# Patient Record
Sex: Female | Born: 1995 | Race: White | Hispanic: No | Marital: Single | Smoking: Never smoker
Health system: Southern US, Community
[De-identification: ages and names within clinical notes are randomized; demographics above are authoritative.]

## PROBLEM LIST (undated history)

## (undated) DIAGNOSIS — Z789 Other specified health status: Secondary | ICD-10-CM

## (undated) DIAGNOSIS — O159 Eclampsia, unspecified as to time period: Secondary | ICD-10-CM

## (undated) HISTORY — DX: Eclampsia, unspecified as to time period: O15.9

---

## 2017-05-19 DIAGNOSIS — B001 Herpesviral vesicular dermatitis: Secondary | ICD-10-CM | POA: Insufficient documentation

## 2017-05-19 DIAGNOSIS — F32A Depression, unspecified: Secondary | ICD-10-CM | POA: Insufficient documentation

## 2017-05-19 DIAGNOSIS — F329 Major depressive disorder, single episode, unspecified: Secondary | ICD-10-CM | POA: Insufficient documentation

## 2018-12-10 ENCOUNTER — Inpatient Hospital Stay (HOSPITAL_COMMUNITY): Payer: Medicaid Other

## 2018-12-10 ENCOUNTER — Inpatient Hospital Stay (HOSPITAL_COMMUNITY): Payer: Medicaid Other | Admitting: Certified Registered"

## 2018-12-10 ENCOUNTER — Other Ambulatory Visit: Payer: Self-pay

## 2018-12-10 ENCOUNTER — Encounter (HOSPITAL_COMMUNITY): Payer: Self-pay

## 2018-12-10 ENCOUNTER — Inpatient Hospital Stay (HOSPITAL_COMMUNITY)
Admission: AD | Admit: 2018-12-10 | Discharge: 2018-12-13 | DRG: 787 | Disposition: A | Discharge status: Home / Self Care | Payer: Medicaid Other | Attending: Obstetrics & Gynecology | Admitting: Obstetrics & Gynecology

## 2018-12-10 ENCOUNTER — Encounter (HOSPITAL_COMMUNITY)
Admission: AD | Disposition: A | Discharge status: Home / Self Care | Payer: Self-pay | Source: Home / Self Care | Attending: Obstetrics & Gynecology

## 2018-12-10 DIAGNOSIS — Z363 Encounter for antenatal screening for malformations: Secondary | ICD-10-CM

## 2018-12-10 DIAGNOSIS — O4103X0 Oligohydramnios, third trimester, not applicable or unspecified: Secondary | ICD-10-CM | POA: Diagnosis present

## 2018-12-10 DIAGNOSIS — Z3490 Encounter for supervision of normal pregnancy, unspecified, unspecified trimester: Secondary | ICD-10-CM

## 2018-12-10 DIAGNOSIS — O151 Eclampsia in labor: Principal | ICD-10-CM | POA: Diagnosis present

## 2018-12-10 DIAGNOSIS — O159 Eclampsia, unspecified as to time period: Secondary | ICD-10-CM | POA: Diagnosis present

## 2018-12-10 DIAGNOSIS — O1500 Eclampsia in pregnancy, unspecified trimester: Secondary | ICD-10-CM

## 2018-12-10 DIAGNOSIS — Z3A34 34 weeks gestation of pregnancy: Secondary | ICD-10-CM

## 2018-12-10 DIAGNOSIS — Z98891 History of uterine scar from previous surgery: Secondary | ICD-10-CM

## 2018-12-10 LAB — RAPID HIV SCREEN (HIV 1/2 AB+AG)
HIV 1/2 Antibodies: NONREACTIVE
HIV-1 P24 Antigen - HIV24: NONREACTIVE

## 2018-12-10 LAB — WET PREP, GENITAL
Clue Cells Wet Prep HPF POC: NONE SEEN
Sperm: NONE SEEN
Trich, Wet Prep: NONE SEEN

## 2018-12-10 LAB — PROTEIN / CREATININE RATIO, URINE
Creatinine, Urine: 23.54 mg/dL
Protein Creatinine Ratio: 30.42 mg/mg{Cre} — ABNORMAL HIGH (ref 0.00–0.15)
Total Protein, Urine: 716 mg/dL

## 2018-12-10 LAB — COMPREHENSIVE METABOLIC PANEL
ALT: 27 U/L (ref 0–44)
AST: 53 U/L — ABNORMAL HIGH (ref 15–41)
Albumin: 2.5 g/dL — ABNORMAL LOW (ref 3.5–5.0)
Alkaline Phosphatase: 108 U/L (ref 38–126)
Anion gap: 13 (ref 5–15)
BUN: 11 mg/dL (ref 6–20)
CO2: 17 mmol/L — ABNORMAL LOW (ref 22–32)
Calcium: 8.9 mg/dL (ref 8.9–10.3)
Chloride: 109 mmol/L (ref 98–111)
Creatinine, Ser: 1.32 mg/dL — ABNORMAL HIGH (ref 0.44–1.00)
GFR calc Af Amer: >60 mL/min (ref >60)
GFR calc non Af Amer: 57 mL/min — ABNORMAL LOW (ref >60)
Glucose, Bld: 99 mg/dL (ref 70–99)
Potassium: 3.4 mmol/L — ABNORMAL LOW (ref 3.5–5.1)
Sodium: 139 mmol/L (ref 135–145)
Total Bilirubin: 0.5 mg/dL (ref 0.3–1.2)
Total Protein: 5.7 g/dL — ABNORMAL LOW (ref 6.5–8.1)

## 2018-12-10 LAB — RAPID URINE DRUG SCREEN, HOSP PERFORMED
Amphetamines: NOT DETECTED
Barbiturates: NOT DETECTED
Benzodiazepines: POSITIVE — AB
Cocaine: NOT DETECTED
Opiates: NOT DETECTED
Tetrahydrocannabinol: NOT DETECTED

## 2018-12-10 LAB — CBC
HCT: 38 % (ref 36.0–46.0)
Hemoglobin: 13.2 g/dL (ref 12.0–15.0)
MCH: 33.8 pg (ref 26.0–34.0)
MCHC: 34.7 g/dL (ref 30.0–36.0)
MCV: 97.4 fL (ref 80.0–100.0)
Platelets: 166 10*3/uL (ref 150–400)
RBC: 3.9 MIL/uL (ref 3.87–5.11)
RDW: 11.9 % (ref 11.5–15.5)
WBC: 19.5 10*3/uL — ABNORMAL HIGH (ref 4.0–10.5)
nRBC: 0.1 % (ref 0.0–0.2)

## 2018-12-10 LAB — HEPATITIS B SURFACE ANTIGEN: Hepatitis B Surface Ag: NEGATIVE

## 2018-12-10 SURGERY — Surgical Case
Anesthesia: Spinal | Site: Abdomen | Wound class: Clean Contaminated

## 2018-12-10 MED ORDER — NALOXONE HCL 0.4 MG/ML IJ SOLN: 0.4000 mg | INTRAMUSCULAR | Status: DC | PRN

## 2018-12-10 MED ORDER — LABETALOL HCL 5 MG/ML IV SOLN: 80.0000 mg | INTRAVENOUS | Status: DC | PRN

## 2018-12-10 MED ORDER — MENTHOL 3 MG MT LOZG: 1.0000 | LOZENGE | OROMUCOSAL | Status: DC | PRN

## 2018-12-10 MED ORDER — SCOPOLAMINE 1 MG/3DAYS TD PT72
1.0000 | MEDICATED_PATCH | Freq: Once | TRANSDERMAL | Status: AC
  Administered 2018-12-10: 1.5 mg via TRANSDERMAL

## 2018-12-10 MED ORDER — ACETAMINOPHEN 10 MG/ML IV SOLN
1000.0000 mg | Freq: Once | INTRAVENOUS | Status: DC | PRN
  Administered 2018-12-10: 1000 mg via INTRAVENOUS

## 2018-12-10 MED ORDER — PHENYLEPHRINE 40 MCG/ML (10ML) SYRINGE FOR IV PUSH (FOR BLOOD PRESSURE SUPPORT)
PREFILLED_SYRINGE | INTRAVENOUS | Status: DC | PRN
  Administered 2018-12-10: 40 ug via INTRAVENOUS
  Administered 2018-12-10 (×2): 80 ug via INTRAVENOUS

## 2018-12-10 MED ORDER — DEXAMETHASONE SODIUM PHOSPHATE 10 MG/ML IJ SOLN
INTRAMUSCULAR | Status: AC
  Filled 2018-12-10: qty 1

## 2018-12-10 MED ORDER — SCOPOLAMINE 1 MG/3DAYS TD PT72
MEDICATED_PATCH | TRANSDERMAL | Status: AC
  Filled 2018-12-10: qty 1

## 2018-12-10 MED ORDER — SODIUM CHLORIDE 0.9 % IV SOLN
INTRAVENOUS | Status: DC | PRN
  Administered 2018-12-10: 08:00:00 via INTRAVENOUS

## 2018-12-10 MED ORDER — SENNOSIDES-DOCUSATE SODIUM 8.6-50 MG PO TABS
2.0000 | ORAL_TABLET | ORAL | Status: DC
  Administered 2018-12-11 – 2018-12-12 (×3): 2 via ORAL
  Filled 2018-12-10 (×3): qty 2

## 2018-12-10 MED ORDER — ACETAMINOPHEN 10 MG/ML IV SOLN
INTRAVENOUS | Status: AC
  Filled 2018-12-10: qty 100

## 2018-12-10 MED ORDER — OXYTOCIN 40 UNITS IN NORMAL SALINE INFUSION - SIMPLE MED
INTRAVENOUS | Status: AC
  Filled 2018-12-10: qty 1000

## 2018-12-10 MED ORDER — MAGNESIUM SULFATE BOLUS VIA INFUSION: 0.0100 g | Freq: Once | INTRAVENOUS | Status: DC

## 2018-12-10 MED ORDER — BETAMETHASONE SOD PHOS & ACET 6 (3-3) MG/ML IJ SUSP
12.0000 mg | Freq: Once | INTRAMUSCULAR | Status: DC
  Filled 2018-12-10: qty 2

## 2018-12-10 MED ORDER — DIPHENHYDRAMINE HCL 50 MG/ML IJ SOLN
12.5000 mg | INTRAMUSCULAR | Status: DC | PRN
  Administered 2018-12-10: 12.5 mg via INTRAVENOUS
  Filled 2018-12-10 (×2): qty 1

## 2018-12-10 MED ORDER — CEFAZOLIN SODIUM-DEXTROSE 2-4 GM/100ML-% IV SOLN
2.0000 g | INTRAVENOUS | Status: AC
  Filled 2018-12-10: qty 100

## 2018-12-10 MED ORDER — MAGNESIUM SULFATE 40 G IN LACTATED RINGERS - SIMPLE: 2.0000 g/h | INTRAVENOUS | Status: DC

## 2018-12-10 MED ORDER — OXYCODONE-ACETAMINOPHEN 5-325 MG PO TABS
1.0000 | ORAL_TABLET | ORAL | Status: DC | PRN
  Administered 2018-12-10 – 2018-12-12 (×4): 2 via ORAL
  Filled 2018-12-10 (×4): qty 2

## 2018-12-10 MED ORDER — PRENATAL MULTIVITAMIN CH
1.0000 | ORAL_TABLET | Freq: Every day | ORAL | Status: DC
  Administered 2018-12-11 – 2018-12-13 (×3): 1 via ORAL
  Filled 2018-12-10 (×4): qty 1

## 2018-12-10 MED ORDER — OXYTOCIN 40 UNITS IN NORMAL SALINE INFUSION - SIMPLE MED: 2.5000 [IU]/h | INTRAVENOUS | Status: AC

## 2018-12-10 MED ORDER — MAGNESIUM SULFATE BOLUS VIA INFUSION
6.0000 g | Freq: Once | INTRAVENOUS | Status: AC
  Administered 2018-12-10: 6 g via INTRAVENOUS
  Filled 2018-12-10: qty 500

## 2018-12-10 MED ORDER — NALBUPHINE HCL 10 MG/ML IJ SOLN: 5.0000 mg | INTRAMUSCULAR | Status: DC | PRN

## 2018-12-10 MED ORDER — SODIUM CHLORIDE 0.9 % IV SOLN
INTRAVENOUS | Status: DC | PRN
  Administered 2018-12-10: 08:00:00 40 [IU] via INTRAVENOUS

## 2018-12-10 MED ORDER — ONDANSETRON HCL 4 MG/2ML IJ SOLN
INTRAMUSCULAR | Status: DC | PRN
  Administered 2018-12-10: 4 mg via INTRAVENOUS

## 2018-12-10 MED ORDER — SODIUM CHLORIDE 0.9 % IR SOLN
Status: DC | PRN
  Administered 2018-12-10: 1000 mL

## 2018-12-10 MED ORDER — WITCH HAZEL-GLYCERIN EX PADS: 1.0000 "application " | MEDICATED_PAD | CUTANEOUS | Status: DC | PRN

## 2018-12-10 MED ORDER — LACTATED RINGERS IV SOLN
INTRAVENOUS | Status: DC
  Administered 2018-12-10 (×2): via INTRAVENOUS

## 2018-12-10 MED ORDER — NALBUPHINE HCL 10 MG/ML IJ SOLN: 5.0000 mg | Freq: Once | INTRAMUSCULAR | Status: DC | PRN

## 2018-12-10 MED ORDER — PHENYLEPHRINE 40 MCG/ML (10ML) SYRINGE FOR IV PUSH (FOR BLOOD PRESSURE SUPPORT)
PREFILLED_SYRINGE | INTRAVENOUS | Status: AC
  Filled 2018-12-10: qty 10

## 2018-12-10 MED ORDER — LABETALOL HCL 5 MG/ML IV SOLN
INTRAVENOUS | Status: AC
  Filled 2018-12-10: qty 4

## 2018-12-10 MED ORDER — ONDANSETRON HCL 4 MG/2ML IJ SOLN
INTRAMUSCULAR | Status: AC
  Filled 2018-12-10: qty 2

## 2018-12-10 MED ORDER — GABAPENTIN 100 MG PO CAPS
100.0000 mg | ORAL_CAPSULE | Freq: Three times a day (TID) | ORAL | Status: DC
  Administered 2018-12-10 – 2018-12-13 (×9): 100 mg via ORAL
  Filled 2018-12-10 (×12): qty 1

## 2018-12-10 MED ORDER — LACTATED RINGERS IV SOLN
INTRAVENOUS | Status: DC
  Administered 2018-12-10: 06:00:00 via INTRAVENOUS

## 2018-12-10 MED ORDER — FAMOTIDINE IN NACL 20-0.9 MG/50ML-% IV SOLN
20.0000 mg | Freq: Once | INTRAVENOUS | Status: DC
  Filled 2018-12-10: qty 50

## 2018-12-10 MED ORDER — PHENYLEPHRINE HCL-NACL 20-0.9 MG/250ML-% IV SOLN
INTRAVENOUS | Status: DC | PRN
  Administered 2018-12-10: 80 ug/min via INTRAVENOUS

## 2018-12-10 MED ORDER — HYDRALAZINE HCL 20 MG/ML IJ SOLN: 10.0000 mg | INTRAMUSCULAR | Status: DC | PRN

## 2018-12-10 MED ORDER — DIBUCAINE (PERIANAL) 1 % EX OINT: 1.0000 "application " | TOPICAL_OINTMENT | CUTANEOUS | Status: DC | PRN

## 2018-12-10 MED ORDER — MAGNESIUM SULFATE 40 G IN LACTATED RINGERS - SIMPLE
2.0000 g/h | INTRAVENOUS | Status: AC
  Administered 2018-12-10: 2 g/h via INTRAVENOUS

## 2018-12-10 MED ORDER — ONDANSETRON HCL 4 MG/2ML IJ SOLN
4.0000 mg | Freq: Three times a day (TID) | INTRAMUSCULAR | Status: DC | PRN
  Administered 2018-12-10: 4 mg via INTRAVENOUS
  Filled 2018-12-10: qty 2

## 2018-12-10 MED ORDER — KETOROLAC TROMETHAMINE 30 MG/ML IJ SOLN: 30.0000 mg | Freq: Four times a day (QID) | INTRAMUSCULAR | Status: DC

## 2018-12-10 MED ORDER — ZOLPIDEM TARTRATE 5 MG PO TABS: 5.0000 mg | ORAL_TABLET | Freq: Every evening | ORAL | Status: DC | PRN

## 2018-12-10 MED ORDER — SIMETHICONE 80 MG PO CHEW: 80.0000 mg | CHEWABLE_TABLET | ORAL | Status: DC | PRN

## 2018-12-10 MED ORDER — LABETALOL HCL 5 MG/ML IV SOLN
20.0000 mg | INTRAVENOUS | Status: DC | PRN
  Administered 2018-12-10: 07:00:00 20 mg via INTRAVENOUS

## 2018-12-10 MED ORDER — BUPIVACAINE IN DEXTROSE 0.75-8.25 % IT SOLN
INTRATHECAL | Status: DC | PRN
  Administered 2018-12-10: 1.6 mL via INTRATHECAL

## 2018-12-10 MED ORDER — SOD CITRATE-CITRIC ACID 500-334 MG/5ML PO SOLN: 30.0000 mL | Freq: Once | ORAL | Status: DC

## 2018-12-10 MED ORDER — MORPHINE SULFATE (PF) 0.5 MG/ML IJ SOLN
INTRAMUSCULAR | Status: AC
  Filled 2018-12-10: qty 10

## 2018-12-10 MED ORDER — SIMETHICONE 80 MG PO CHEW
80.0000 mg | CHEWABLE_TABLET | Freq: Three times a day (TID) | ORAL | Status: DC
  Administered 2018-12-10 – 2018-12-13 (×9): 80 mg via ORAL
  Filled 2018-12-10 (×9): qty 1

## 2018-12-10 MED ORDER — SIMETHICONE 80 MG PO CHEW
80.0000 mg | CHEWABLE_TABLET | ORAL | Status: DC
  Administered 2018-12-11 – 2018-12-12 (×2): 80 mg via ORAL
  Filled 2018-12-10 (×2): qty 1

## 2018-12-10 MED ORDER — PHENYLEPHRINE HCL-NACL 20-0.9 MG/250ML-% IV SOLN
INTRAVENOUS | Status: AC
  Filled 2018-12-10: qty 250

## 2018-12-10 MED ORDER — FENTANYL CITRATE (PF) 100 MCG/2ML IJ SOLN
INTRAMUSCULAR | Status: AC
  Filled 2018-12-10: qty 2

## 2018-12-10 MED ORDER — ENOXAPARIN SODIUM 60 MG/0.6ML ~~LOC~~ SOLN
60.0000 mg | SUBCUTANEOUS | Status: DC
  Administered 2018-12-11 – 2018-12-13 (×3): 60 mg via SUBCUTANEOUS
  Filled 2018-12-10 (×3): qty 0.6

## 2018-12-10 MED ORDER — FAMOTIDINE 20 MG IN NS 100 ML IVPB
20.0000 mg | Freq: Once | INTRAVENOUS | Status: DC
  Filled 2018-12-10: qty 100

## 2018-12-10 MED ORDER — FENTANYL CITRATE (PF) 100 MCG/2ML IJ SOLN
INTRAMUSCULAR | Status: DC | PRN
  Administered 2018-12-10: 15 ug via INTRATHECAL

## 2018-12-10 MED ORDER — DIPHENHYDRAMINE HCL 25 MG PO CAPS
25.0000 mg | ORAL_CAPSULE | ORAL | Status: DC | PRN
  Filled 2018-12-10: qty 1

## 2018-12-10 MED ORDER — COCONUT OIL OIL
1.0000 "application " | TOPICAL_OIL | Status: DC | PRN
  Administered 2018-12-10: 1 via TOPICAL

## 2018-12-10 MED ORDER — TETANUS-DIPHTH-ACELL PERTUSSIS 5-2.5-18.5 LF-MCG/0.5 IM SUSP: 0.5000 mL | Freq: Once | INTRAMUSCULAR | Status: DC

## 2018-12-10 MED ORDER — DEXAMETHASONE SODIUM PHOSPHATE 10 MG/ML IJ SOLN
INTRAMUSCULAR | Status: DC | PRN
  Administered 2018-12-10: 10 mg via INTRAVENOUS

## 2018-12-10 MED ORDER — LABETALOL HCL 5 MG/ML IV SOLN
40.0000 mg | INTRAVENOUS | Status: DC | PRN
  Filled 2018-12-10: qty 8

## 2018-12-10 MED ORDER — SODIUM CHLORIDE 0.9% FLUSH: 3.0000 mL | INTRAVENOUS | Status: DC | PRN

## 2018-12-10 MED ORDER — DIPHENHYDRAMINE HCL 25 MG PO CAPS: 25.0000 mg | ORAL_CAPSULE | Freq: Four times a day (QID) | ORAL | Status: DC | PRN

## 2018-12-10 MED ORDER — MORPHINE SULFATE (PF) 0.5 MG/ML IJ SOLN
INTRAMUSCULAR | Status: DC | PRN
  Administered 2018-12-10: .15 mg via INTRATHECAL

## 2018-12-10 MED ORDER — NALOXONE HCL 4 MG/10ML IJ SOLN
1.0000 ug/kg/h | INTRAVENOUS | Status: DC | PRN
  Filled 2018-12-10: qty 5

## 2018-12-10 MED ORDER — KETOROLAC TROMETHAMINE 30 MG/ML IJ SOLN
30.0000 mg | Freq: Four times a day (QID) | INTRAMUSCULAR | Status: DC
  Administered 2018-12-10 – 2018-12-12 (×8): 30 mg via INTRAVENOUS
  Filled 2018-12-10 (×9): qty 1

## 2018-12-10 MED ORDER — CEFAZOLIN SODIUM-DEXTROSE 2-3 GM-%(50ML) IV SOLR
INTRAVENOUS | Status: DC | PRN
  Administered 2018-12-10: 2 g via INTRAVENOUS

## 2018-12-10 SURGICAL SUPPLY — 32 items
CHLORAPREP W/TINT 26ML (MISCELLANEOUS) ×3 IMPLANT
CLAMP CORD UMBIL (MISCELLANEOUS) ×3 IMPLANT
CLOTH BEACON ORANGE TIMEOUT ST (SAFETY) ×3 IMPLANT
DERMABOND ADVANCED (GAUZE/BANDAGES/DRESSINGS) ×4
DERMABOND ADVANCED .7 DNX12 (GAUZE/BANDAGES/DRESSINGS) ×2 IMPLANT
DRSG OPSITE POSTOP 4X10 (GAUZE/BANDAGES/DRESSINGS) ×3 IMPLANT
ELECT REM PT RETURN 9FT ADLT (ELECTROSURGICAL) ×3
ELECTRODE REM PT RTRN 9FT ADLT (ELECTROSURGICAL) ×1 IMPLANT
GLOVE BIOGEL PI IND STRL 7.0 (GLOVE) ×1 IMPLANT
GLOVE BIOGEL PI IND STRL 8 (GLOVE) ×1 IMPLANT
GLOVE BIOGEL PI INDICATOR 7.0 (GLOVE) ×2
GLOVE BIOGEL PI INDICATOR 8 (GLOVE) ×2
GLOVE ECLIPSE 8.0 STRL XLNG CF (GLOVE) ×3 IMPLANT
GOWN STRL REUS W/TWL LRG LVL3 (GOWN DISPOSABLE) ×6 IMPLANT
KIT ABG SYR 3ML LUER SLIP (SYRINGE) ×3 IMPLANT
NEEDLE HYPO 18GX1.5 BLUNT FILL (NEEDLE) ×3 IMPLANT
NEEDLE HYPO 22GX1.5 SAFETY (NEEDLE) ×3 IMPLANT
NEEDLE HYPO 25X5/8 SAFETYGLIDE (NEEDLE) ×3 IMPLANT
NS IRRIG 1000ML POUR BTL (IV SOLUTION) ×3 IMPLANT
PACK C SECTION WH (CUSTOM PROCEDURE TRAY) ×3 IMPLANT
PAD OB MATERNITY 4.3X12.25 (PERSONAL CARE ITEMS) ×3 IMPLANT
PENCIL SMOKE EVAC W/HOLSTER (ELECTROSURGICAL) ×3 IMPLANT
SUT CHROMIC 0 CT 1 (SUTURE) ×3 IMPLANT
SUT MNCRL 0 VIOLET CTX 36 (SUTURE) ×2 IMPLANT
SUT MONOCRYL 0 CTX 36 (SUTURE) ×4
SUT PLAIN 2 0 XLH (SUTURE) ×3 IMPLANT
SUT VIC AB 0 CTX 36 (SUTURE) ×2
SUT VIC AB 0 CTX36XBRD ANBCTRL (SUTURE) ×1 IMPLANT
SUT VIC AB 4-0 KS 27 (SUTURE) ×3 IMPLANT
SYR 20CC LL (SYRINGE) ×6 IMPLANT
TOWEL OR 17X24 6PK STRL BLUE (TOWEL DISPOSABLE) ×3 IMPLANT
WATER STERILE IRR 1000ML POUR (IV SOLUTION) ×3 IMPLANT

## 2018-12-10 NOTE — Anesthesia Procedure Notes (Signed)
Spinal  Patient location during procedure: OR Start time: 12/10/2018 7:50 AM End time: 12/10/2018 8:00 AM Staffing Anesthesiologist: Elmer Picker, MD Performed: anesthesiologist  Preanesthetic Checklist Completed: patient identified, surgical consent, pre-op evaluation, timeout performed, IV checked, risks and benefits discussed and monitors and equipment checked Spinal Block Patient position: sitting Prep: site prepped and draped and DuraPrep Patient monitoring: cardiac monitor, continuous pulse ox and blood pressure Approach: midline Location: L3-4 Injection technique: single-shot Needle Needle type: Pencan  Needle gauge: 24 G Needle length: 9 cm Assessment Sensory level: T6 Additional Notes Functioning IV was confirmed and monitors were applied. Sterile prep and drape, including hand hygiene and sterile gloves were used. The patient was positioned and the spine was prepped. The skin was anesthetized with lidocaine.  Free flow of clear CSF was obtained prior to injecting local anesthetic into the CSF.  The spinal needle aspirated freely following injection.  The needle was carefully withdrawn.  The patient tolerated the procedure well.

## 2018-12-10 NOTE — Transfer of Care (Signed)
Immediate Anesthesia Transfer of Care Note  Patient: Margaret Reyes  Procedure(s) Performed: CESAREAN SECTION (N/A Abdomen)  Patient Location: PACU  Anesthesia Type:Spinal  Level of Consciousness: awake, alert  and oriented  Airway & Oxygen Therapy: Patient Spontanous Breathing  Post-op Assessment: Report given to RN and Post -op Vital signs reviewed and stable  Post vital signs: Reviewed and stable  Last Vitals:  Vitals Value Taken Time  BP    Temp    Pulse    Resp    SpO2      Last Pain:  Vitals:   12/10/18 0610  TempSrc: Oral         Complications: No apparent anesthesia complications

## 2018-12-10 NOTE — Anesthesia Postprocedure Evaluation (Signed)
Anesthesia Post Note  Patient: Margaret Reyes  Procedure(s) Performed: CESAREAN SECTION (N/A Abdomen)     Patient location during evaluation: PACU Anesthesia Type: Spinal Level of consciousness: oriented and awake and alert Pain management: pain level controlled Vital Signs Assessment: post-procedure vital signs reviewed and stable Respiratory status: spontaneous breathing, respiratory function stable and patient connected to nasal cannula oxygen Cardiovascular status: blood pressure returned to baseline and stable Postop Assessment: no headache, no backache and no apparent nausea or vomiting Anesthetic complications: no    Last Vitals:  Vitals:   12/10/18 0731 12/10/18 0900  BP: (!) 148/104   Pulse: 88 74  Resp:    Temp:  36.7 C    Last Pain:  Vitals:   12/10/18 0610  TempSrc: Oral   Pain Goal:                Epidural/Spinal Function Cutaneous sensation: No Sensation (12/10/18 0900), Patient able to flex knees: No (12/10/18 0900), Patient able to lift hips off bed: No (12/10/18 0900), Back pain beyond tenderness at insertion site: No (12/10/18 0900)  Aleja Yearwood L Lauraann Missey

## 2018-12-10 NOTE — MAU Provider Note (Signed)
History     CSN: [id number]  Arrival date and time: 12/10/18 0541   None     Chief Complaint  Patient presents with  . Seizures   HPI   Ms.Margaret Reyes is a 23 y.o. female G1P0 @ Unknown gestation brought in by EMS due to seizure activity that was witnessed at home by the patient's fiance. The patient is receiving her prenatal care in [place] and has been complaining of headaches for the past few days. The patient's fiance states he witnessed the patient "foaming at the mouth". He then called 911.  Patient denies bleeding.   OB History    Gravida  1   Para      Term      Preterm      AB      Living        SAB      TAB      Ectopic      Multiple      Live Births              No past medical history on file.    No family history on file.  Social History   Tobacco Use  . Smoking status: Not on file  Substance Use Topics  . Alcohol use: Not on file  . Drug use: Not on file    Allergies: No Known Allergies  No medications prior to admission.   Review of Systems  Eyes: Negative for photophobia.  Genitourinary: Negative for vaginal bleeding.  Neurological: Positive for headaches.  Psychiatric/Behavioral: Positive for behavioral problems and confusion. Negative for hallucinations.   Physical Exam   Blood pressure (!) 164/93, pulse 95, temperature 98.4 F (36.9 C), temperature source Oral, resp. rate 20, height 5\' 3"  (1.6 m).   Patient Vitals for the past 24 hrs:  BP Temp Temp src Pulse Resp Height  12/10/18 0631 (!) 164/93 - - 95 20 -  12/10/18 0624 (!) 165/103 - - 94 20 -  12/10/18 0622 (!) 159/109 - - 94 20 -  12/10/18 0612 - - - - - 5\' 3"  (1.6 m)  12/10/18 0610 - 98.4 F (36.9 C) Oral - - -  12/10/18 0607 (!) 149/94 - - 96 - -  12/10/18 0551 129/81 - - (!) 102 - -  12/10/18 0542 132/88 - - (!) 117 - -    Physical Exam  Constitutional: She appears well-developed and well-nourished. No distress.  Eyes: Pupils are equal, round,  and reactive to light.  Neck: Neck supple.  Cardiovascular: Normal rate.  Respiratory: Effort normal.  GI: Soft. She exhibits no distension. There is no abdominal tenderness. There is no rebound.  Musculoskeletal: Normal range of motion.  Neurological: She displays abnormal reflex.  Skin: Skin is warm. She is not diaphoretic.  Psychiatric: Her behavior is normal.   Fetal Tracing: Baseline: 140 bpm Variability: minimal  Accelerations: none  Decelerations: late Toco: Occasional   MAU Course  Procedures  None  MDM  Received encode of a 24 weeker who experienced a witnessed seizure at home. EMS was bringing the patient in via emergency traffic. She received 3 doses of ativan on the EMS truck and was stable at the time of the encode however mildly delirious.  Dr. Despina Hidden was notified and currently in the OR; we will communicate via phone until he is able to come to MAU Patient arrived with confusion; stated she did not know she was pregnant, says she has had no prenatal care.  This information was confirmed by patient's mother to be inaccurate. The patient's mother says she is 34 weeks and has been receiving regular prenatal care in [place].  Bedside US completed and confirms estimated gestational age of 22-34 weeks.  Magnesium started upon arrival Labs drawn. Dr. Despina HiddenEure at bedside: Cesarean section called by Dr. Despina HiddenEure.  Hypertensive protocol initiated.   Assessment and Plan   Eclampsia [redacted] weeks gestation  Cesarean section planned   Duane Lopeasch, Tyton Abdallah I, NP 12/10/2018 6:51 AM

## 2018-12-10 NOTE — Anesthesia Preprocedure Evaluation (Signed)
Anesthesia Evaluation  Patient identified by MRN, date of birth, ID band Patient awake    Reviewed: Allergy & Precautions, NPO status , Patient's Chart, lab work & pertinent test results  Airway Mallampati: II  TM Distance: >3 FB Neck ROM: Full    Dental no notable dental hx.    Pulmonary neg pulmonary ROS,    Pulmonary exam normal breath sounds clear to auscultation       Cardiovascular negative cardio ROS Normal cardiovascular exam Rhythm:Regular Rate:Normal     Neuro/Psych negative neurological ROS  negative psych ROS   GI/Hepatic negative GI ROS, Neg liver ROS,   Endo/Other  negative endocrine ROS  Renal/GU negative Renal ROS  negative genitourinary   Musculoskeletal negative musculoskeletal ROS (+)   Abdominal   Peds negative pediatric ROS (+)  Hematology negative hematology ROS (+)   Anesthesia Other Findings [redacted] wks EGA, pregnancy c/b eclampsia, seizure x2 on route to hospital, now on mag  Reproductive/Obstetrics (+) Pregnancy                            Anesthesia Physical Anesthesia Plan  ASA: III  Anesthesia Plan: Spinal   Post-op Pain Management:    Induction:   PONV Risk Score and Plan: Treatment may vary due to age or medical condition  Airway Management Planned: Natural Airway  Additional Equipment:   Intra-op Plan:   Post-operative Plan:   Informed Consent: I have reviewed the patients History and Physical, chart, labs and discussed the procedure including the risks, benefits and alternatives for the proposed anesthesia with the patient or authorized representative who has indicated his/her understanding and acceptance.     Dental advisory given  Plan Discussed with: CRNA  Anesthesia Plan Comments:         Anesthesia Quick Evaluation

## 2018-12-10 NOTE — H&P (Signed)
Preoperative History and Physical  Margaret Reyes is a 23 y.o. G1P0EGA 34 weeks who has received her PNC in [place], [place] who has been experiencing migraines for 2 days.  She started having "foaming at the mouth" according to her mother earlier this morning.  She then started having seizure activity and EMS was called.  She has had at least 3 seizures on the way to the ED on EMS.  She received ativan x 3 before her activity stopped.  admitted for a primary Caesarean section.    PMH:   No past medical history on file.  PSH:      POb/GynH:      OB History    Gravida  1   Para      Term      Preterm      AB      Living        SAB      TAB      Ectopic      Multiple      Live Births              SH:   Social History   Tobacco Use  . Smoking status: Not on file  Substance Use Topics  . Alcohol use: Not on file  . Drug use: Not on file    FH:   No family history on file.   Allergies: No Known Allergies  Medications:       Current Facility-Administered Medications:  .  famotidine (PEPCID) IVPB 20 mg premix, 20 mg, Intravenous, Once, Melida Quitter, Megan L, RPH .  labetalol (NORMODYNE) injection 20 mg, 20 mg, Intravenous, PRN, 20 mg at 12/10/18 0630 **AND** labetalol (NORMODYNE) injection 40 mg, 40 mg, Intravenous, PRN **AND** labetalol (NORMODYNE) injection 80 mg, 80 mg, Intravenous, PRN **AND** hydrALAZINE (APRESOLINE) injection 10 mg, 10 mg, Intravenous, PRN **AND** Measure blood pressure, , , Once, Rasch, Jennifer I, NP .  labetalol (NORMODYNE) 5 MG/ML injection, , , ,  .  lactated ringers infusion, , Intravenous, Continuous, Rasch, Jennifer I, NP, Last Rate: 125 mL/hr at 12/10/18 0618 .  magnesium bolus via infusion 6 g, 6 g, Intravenous, Once, Rasch, Victorino Dike I, NP, 6 g at 12/10/18 0622 .  magnesium sulfate 40 grams in LR 500 mL OB infusion, 2 g/hr, Intravenous, Titrated, Rasch, Victorino Dike I, NP .  sodium citrate-citric acid (ORACIT) solution 30 mL, 30 mL, Oral,  Once, Shashana Fullington, Amaryllis Dyke, MD  Review of Systems:   Review of Systems  Constitutional: Negative for fever, chills, weight loss, malaise/fatigue and diaphoresis.  HENT: Negative for hearing loss, ear pain, nosebleeds, congestion, sore throat, neck pain, tinnitus and ear discharge.   Eyes: Negative for blurred vision, double vision, photophobia, pain, discharge and redness.  Respiratory: Negative for cough, hemoptysis, sputum production, shortness of breath, wheezing and stridor.   Cardiovascular: Negative for chest pain, palpitations, orthopnea, claudication, leg swelling and PND.  Gastrointestinal: Positive for abdominal pain. Negative for heartburn, nausea, vomiting, diarrhea, constipation, blood in stool and melena.  Genitourinary: Negative for dysuria, urgency, frequency, hematuria and flank pain.  Musculoskeletal: Negative for myalgias, back pain, joint pain and falls.  Skin: Negative for itching and rash.  Neurological: Negative for dizziness, tingling, tremors, sensory change, speech change, focal weakness, seizures, loss of consciousness, weakness and headaches.  Endo/Heme/Allergies: Negative for environmental allergies and polydipsia. Does not bruise/bleed easily.  Psychiatric/Behavioral: Negative for depression, suicidal ideas, hallucinations, memory loss and substance abuse. The patient is not nervous/anxious and does not  have insomnia.      PHYSICAL EXAM:  Blood pressure (!) 149/94, pulse 96, temperature 98.4 F (36.9 C), temperature source Oral, height 5\' 3"  (1.6 m).    Vitals reviewed. Constitutional: She is oriented to person, place, and time. She appears well-developed and well-nourished.  HENT:  Head: Normocephalic and atraumatic.  Right Ear: External ear normal.  Left Ear: External ear normal.  Nose: Nose normal.  Mouth/Throat: Oropharynx is clear and moist.  Eyes: Conjunctivae and EOM are normal. Pupils are equal, round, and reactive to light. Right eye exhibits no  discharge. Left eye exhibits no discharge. No scleral icterus.  Neck: Normal range of motion. Neck supple. No tracheal deviation present. No thyromegaly present.  Cardiovascular: Normal rate, regular rhythm, normal heart sounds and intact distal pulses.  Exam reveals no gallop and no friction rub.   No murmur heard. Respiratory: Effort normal and breath sounds normal. No respiratory distress. She has no wheezes. She has no rales. She exhibits no tenderness.  GI: Soft. Bowel sounds are normal. She exhibits no distension and no mass. There is tenderness. There is no rebound and no guarding.  Genitourinary:       Vulva is normal without lesions Vagina is pink moist without discharge Cervix normal in appearance and pap is normal Uterus is enlarged, 34 weeks size Adnexa is negative with normal sized ovaries by sonogram  Musculoskeletal: Normal range of motion. She exhibits no edema and no tenderness.  Neurological: She is alert and oriented to person, place, and time. She has normal reflexes. She displays normal reflexes. No cranial nerve deficit. She exhibits normal muscle tone. Coordination normal.  Skin: Skin is warm and dry. No rash noted. No erythema. No pallor.  Psychiatric: She has a normal mood and affect. Her behavior is normal. Judgment and thought content normal.    Labs: No results found for this or any previous visit (from the past 336 hour(s)).  EKG: No orders found for this or any previous visit.  Imaging Studies: Prelim sonogram vertex severe oligohydramnios EGA 33+ weeks EFM no variability with occasional late deceleration    Assessment: [redacted] weeks EGA Eclampsia Non reassuring fetal status  Plan: Proceed with primary Caesarean section Has received 2 doses IV labetalol for BP control Magnesium prophylaxis Labs pending  Lazaro ArmsLuther H Kadeisha Betsch 12/10/2018 6:33 AM

## 2018-12-10 NOTE — Op Note (Signed)
Preoperative diagnosis:  1.  Intrauterine pregnancy at  [redacted] weeks gestation                                         2.  Eclampsia                                         3.  Non reassuring fetal status   Postoperative diagnosis:  Same as above   Procedure:  Primary cesarean section  Surgeon:  Lazaro Arms MD  Assistant:    Anesthesia: Spinal  Findings:  .    Over a low transverse incision was delivered a viable female with Apgars of 8 and 10 weighing 4 lbs. 2 oz. Uterus, tubes and ovaries were all normal.  There were no other significant findings  Description of operation:  Patient was taken to the operating room and placed in the sitting position where she underwent a spinal anesthetic. She was then placed in the supine position with tilt to the left side. When adequate anesthetic level was obtained she was prepped and draped in usual sterile fashion and a Foley catheter was placed. A Pfannenstiel skin incision was made and carried down sharply to the rectus fascia which was scored in the midline extended laterally. The fascia was taken off the muscles both superiorly and without difficulty. The muscles were divided.  The peritoneal cavity was entered.  Bladder blade was placed, no bladder flap was created.  A low transverse hysterotomy incision was made and delivered a viable female  infant at [phone] with Apgars of 8 and 10 weighing4 lbs 2 oz.  Cord pH was obtained and was pending. The uterus was exteriorized. It was closed in 2 layers, the first being a running interlocking layer and the second being an imbricating layer using 0 monocryl on a CTX needle. There was good resulting hemostasis. The uterus tubes and ovaries were all normal. Peritoneal cavity was irrigated vigorously. The muscles and peritoneum were reapproximated loosely. The fascia was closed using 0 Vicryl in running fashion. Subcutaneous tissue was made hemostatic and irrigated. The skin was closed using 4-0 Vicryl on a Keith needle in  a subcuticular fashion.  Dermabond was placed for additional wound integrity and to serve as a barrier. Blood loss for the procedure was 155 cc. The patient received a gram of Ancef prophylactically. The patient was taken to the recovery room in good stable condition with all counts being correct x3.  EBL 155 cc  Lazaro Arms 12/10/2018 8:54 AM

## 2018-12-11 LAB — CBC
HCT: 34.2 % — ABNORMAL LOW (ref 36.0–46.0)
Hemoglobin: 11.6 g/dL — ABNORMAL LOW (ref 12.0–15.0)
MCH: 33 pg (ref 26.0–34.0)
MCHC: 33.9 g/dL (ref 30.0–36.0)
MCV: 97.4 fL (ref 80.0–100.0)
Platelets: 148 10*3/uL — ABNORMAL LOW (ref 150–400)
RBC: 3.51 MIL/uL — ABNORMAL LOW (ref 3.87–5.11)
RDW: 12.4 % (ref 11.5–15.5)
WBC: 16.4 10*3/uL — ABNORMAL HIGH (ref 4.0–10.5)
nRBC: 0 % (ref 0.0–0.2)

## 2018-12-11 LAB — GC/CHLAMYDIA PROBE AMP (~~LOC~~) NOT AT ARMC
Chlamydia: NEGATIVE
Neisseria Gonorrhea: NEGATIVE

## 2018-12-11 LAB — KLEIHAUER-BETKE STAIN
# Vials RhIg: 1
Fetal Cells %: 0 %
Quantitation Fetal Hemoglobin: 0 mL

## 2018-12-11 LAB — RPR: RPR Ser Ql: NONREACTIVE

## 2018-12-11 LAB — RUBELLA SCREEN: Rubella: 1.92 index (ref >0.99)

## 2018-12-11 MED ORDER — ENALAPRIL MALEATE 10 MG PO TABS
10.0000 mg | ORAL_TABLET | Freq: Two times a day (BID) | ORAL | Status: DC
  Administered 2018-12-11: 10 mg via ORAL
  Filled 2018-12-11: qty 1

## 2018-12-11 MED ORDER — ENALAPRIL MALEATE 10 MG PO TABS
10.0000 mg | ORAL_TABLET | Freq: Every day | ORAL | Status: DC
  Administered 2018-12-11: 10:00:00 10 mg via ORAL
  Filled 2018-12-11: qty 1

## 2018-12-11 MED ORDER — RHO D IMMUNE GLOBULIN 1500 UNIT/2ML IJ SOSY
300.0000 ug | PREFILLED_SYRINGE | Freq: Once | INTRAMUSCULAR | Status: AC
  Administered 2018-12-12: 300 ug via INTRAVENOUS
  Filled 2018-12-11: qty 2

## 2018-12-11 NOTE — Progress Notes (Signed)
Subjective: Postpartum Day 1: Cesarean Delivery Patient reports feeling well. She is ambulating to the bathroom without complications or symptoms. She is tolerating a regular diet.    Objective: Vital signs in last 24 hours: Temp:  [97.8 F (36.6 C)-98.9 F (37.2 C)] 98.5 F (36.9 C) (04/28 0810) Pulse Rate:  [66-75] 75 (04/28 0810) Resp:  [16-18] 18 (04/28 0810) BP: (108-163)/(82-99) 119/99 (04/28 0810) SpO2:  [93 %-99 %] 97 % (04/28 0810) Weight:  [125.7 kg] 125.7 kg (04/27 1136)  Physical Exam:  General: alert, cooperative and no distress Lochia: appropriate Uterine Fundus: firm Incision: honeycomb dressing clean dry and intact DVT Evaluation: No evidence of DVT seen on physical exam.  Recent Labs    12/10/18 0605 12/11/18 0609  HGB 13.2 11.6*  HCT 38.0 34.2*    Assessment/Plan: Status post Cesarean section. Doing well postoperatively.  Patient to complete magnesium sulfate for seizure prophylaxis this morning Will start enalapril 10 mg daily due to persistent HTN Continue routine postpartum care.  Cherokee Clowers 12/11/2018, 11:00 AM

## 2018-12-11 NOTE — Lactation Note (Signed)
This note was copied from a baby's chart. Lactation Consultation Note  Patient Name: Margaret Reyes Date: 12/11/2018 Reason for consult: Initial assessment;Late-preterm 34-36.6wks;Primapara;1st time breastfeeding;Infant < 6lbs;NICU baby  P1 mother whose infant is now 29 hours old.  This is a LPTI at 34+0 weeks weighing < 6 lbs.  Baby is in the NICU.  Mother is awake but sleepy and receiving magnesium which will be discontinued, per RN, in approximately 1/2 hour.    Educated parents about the LPTI and what they can expect as is relates to breast feeding.  Mother's feeding plan is to exclusively breast feed.  Initiated the DEBP with instructions for use.  Pump parts, assembly, disassembly and cleaning reviewed.  #24 flange size is appropriate at this time.  Mother's breasts are large, soft and non tender and nipples are very short shafted and intact.  Breast shells and hand pump provided with instructions.    Instructed mother to pump at least every 3 hours for 15 minutes.  Pumping plan written on white board in room.  Colostrum container provided for any EBM she receives with pumping.  Mother will use colostrum drops for nipple/areola comfort and will have father take drops to NICU for baby.  Informed mother that she can pump in the NICU also and to bring pump parts with her.  RN will obtain labels.  Encouraged STS as much as possible when mother is able to go to the NICU.  Mother is on her mother's insurance plan for just a short time.  I encouraged her to call insurance company today to obtain a DEBP.  If unable to obtain a pump, mother participates in [facility] in [place].  [facility] referral faxed.    Encouraged parents to call for further questions/concerns since a lot of information was presented this morning.  Mom made aware of O/P services, breastfeeding support groups, community resources, and our phone # for post-discharge questions. Provided booklet, "Providing Breast milk For  Your Baby in the NICU" and virtual support group handout.  Father present and supportive.   Maternal Data Formula Feeding for Exclusion: No Has patient been taught Hand Expression?: Yes Does the patient have breastfeeding experience prior to this delivery?: No  Feeding    LATCH Score                   Interventions    Lactation Tools Discussed/Used WIC Program: Yes Pump Review: Setup, frequency, and cleaning;Milk Storage Initiated by:: Lavone Barrientes Date initiated:: 12/11/18   Consult Status Consult Status: Follow-up Date: 12/12/18 Follow-up type: In-patient    Margaret Reyes 12/11/2018, 9:20 AM

## 2018-12-12 LAB — CULTURE, BETA STREP (GROUP B ONLY)

## 2018-12-12 MED ORDER — HYDROCHLOROTHIAZIDE 25 MG PO TABS
25.0000 mg | ORAL_TABLET | Freq: Every day | ORAL | Status: DC
  Administered 2018-12-12 – 2018-12-13 (×2): 25 mg via ORAL
  Filled 2018-12-12 (×2): qty 1

## 2018-12-12 MED ORDER — ENALAPRIL MALEATE 10 MG PO TABS
20.0000 mg | ORAL_TABLET | Freq: Two times a day (BID) | ORAL | Status: DC
  Administered 2018-12-12 – 2018-12-13 (×3): 20 mg via ORAL
  Filled 2018-12-12: qty 4
  Filled 2018-12-12 (×2): qty 2

## 2018-12-12 MED ORDER — FUROSEMIDE 40 MG PO TABS
40.0000 mg | ORAL_TABLET | Freq: Two times a day (BID) | ORAL | Status: AC
  Administered 2018-12-12 (×2): 40 mg via ORAL
  Filled 2018-12-12 (×2): qty 1

## 2018-12-12 NOTE — Progress Notes (Signed)
Patient screened out for psychosocial assessment since none of the following apply:  Psychosocial stressors documented in mother or baby's chart  Gestation less than 32 weeks  Code at delivery   Infant with anomalies Please contact the Clinical Social Worker if specific needs arise, by MOB's request, or if MOB scores greater than 9/yes to question 10 on Edinburgh Postpartum Depression Screen.  Avice Funchess, LCSW Clinical Social Worker [facility] Cell#: [phone]     

## 2018-12-12 NOTE — Progress Notes (Signed)
Subjective: Postpartum Day 2: Cesarean Delivery Patient reports feeling well and is without complaints.    Objective: Vital signs in last 24 hours: Temp:  [98.3 F (36.8 C)-99.3 F (37.4 C)] 98.4 F (36.9 C) (04/29 0805) Pulse Rate:  [70-88] 81 (04/29 0805) Resp:  [18-20] 20 (04/29 0805) BP: (133-163)/(82-109) 144/82 (04/29 0805) SpO2:  [97 %-100 %] 98 % (04/29 0805)  Physical Exam:  General: alert, cooperative and no distress Lochia: appropriate Uterine Fundus: firm Incision: honeycomb dressing clean, dry and intact DVT Evaluation: No evidence of DVT seen on physical exam.  Recent Labs    12/10/18 0605 12/11/18 0609  HGB 13.2 11.6*  HCT 38.0 34.2*    Assessment/Plan: Status post Cesarean section. Doing well postoperatively.  Continue current care Continue monitoring BP Discharge planning tomorow.  Margaret Reyes 12/12/2018, 9:43 AM

## 2018-12-13 LAB — RH IG WORKUP (INCLUDES ABO/RH)
ABO/RH(D): A NEG
Fetal Screen: NEGATIVE
Gestational Age(Wks): 34
Unit division: 0

## 2018-12-13 MED ORDER — AMLODIPINE BESYLATE 10 MG PO TABS
10.0000 mg | ORAL_TABLET | Freq: Every day | ORAL | Status: DC
  Administered 2018-12-13: 10 mg via ORAL
  Filled 2018-12-13: qty 1

## 2018-12-13 MED ORDER — AMLODIPINE BESYLATE 10 MG PO TABS: 10.0000 mg | ORAL_TABLET | Freq: Every day | ORAL | 0 refills | Status: DC

## 2018-12-13 MED ORDER — ACYCLOVIR 200 MG PO CAPS: 400.0000 mg | ORAL_CAPSULE | Freq: Two times a day (BID) | ORAL | 6 refills | Status: DC

## 2018-12-13 MED ORDER — IBUPROFEN 600 MG PO TABS: 600.0000 mg | ORAL_TABLET | Freq: Four times a day (QID) | ORAL | 3 refills | Status: AC | PRN

## 2018-12-13 MED ORDER — ACYCLOVIR 200 MG PO CAPS
400.0000 mg | ORAL_CAPSULE | Freq: Two times a day (BID) | ORAL | Status: DC
  Filled 2018-12-13 (×2): qty 2

## 2018-12-13 MED ORDER — OXYCODONE-ACETAMINOPHEN 5-325 MG PO TABS: 1.0000 | ORAL_TABLET | ORAL | 0 refills | Status: AC | PRN

## 2018-12-13 MED ORDER — ENALAPRIL MALEATE 20 MG PO TABS: 20.0000 mg | ORAL_TABLET | Freq: Two times a day (BID) | ORAL | 0 refills | Status: DC

## 2018-12-13 MED ORDER — HYDROCHLOROTHIAZIDE 25 MG PO TABS: 25.0000 mg | ORAL_TABLET | Freq: Every day | ORAL | 0 refills | Status: DC

## 2018-12-13 NOTE — Lactation Note (Signed)
This note was copied from a baby's chart. Lactation Consultation Note  Patient Name: Margaret Reyes [id number] Date: 12/13/2018 Reason for consult: Follow-up assessment;Primapara;1st time breastfeeding;NICU baby;Preterm <34wks GHTN  Visited with [name] of NICU baby at 74 hrs old.  Mom is for discharge today.    Mom had just finished pumping, and resized flanges to 27 mm as a better fit.  Reviewed importance of washing of disassembled pump parts and rinsing, and air drying after each use.    More colostrum containers and larger bottles for milk storage given to Mom.   Mom is planning to go by [facility] office today on her way home to obtain her DEBP.  Mom aware of loaner pump available for $30 deposit.   Mom aware of Symphony pump in baby's room in NICU.  Instructed Mom to take pump parts with her. Washed all the pump parts and provided Mom with 2 basins for washing and drying of parts.   Mom encouraged to do STS with baby when able.  Double pump and do breast massage and hand expression >8 times per 24 hrs.   Engorgement prevention and treatment reviewed.  Mom aware of OP lactation support available to her.  Encouraged to call prn.   Interventions Interventions: Breast feeding basics reviewed;Skin to skin;Breast massage;Hand express;DEBP  Lactation Tools Discussed/Used Tools: Pump Breast pump type: Double-Electric Breast Pump   Consult Status Consult Status: Complete Date: 12/13/18 Follow-up type: Call as needed    Margaret Reyes 12/13/2018, 9:23 AM

## 2018-12-13 NOTE — Progress Notes (Signed)
MD aware of severe range BP, states to just give PO meds early

## 2018-12-13 NOTE — Discharge Instructions (Signed)

## 2018-12-13 NOTE — Progress Notes (Signed)
Discharge teaching complete with pt. All questions answered and preeclampsia signs and symptoms sheet given to patient.

## 2018-12-13 NOTE — Discharge Summary (Signed)
OB Discharge Summary     Patient Name: Margaret Reyes DOB: 04/26/1996 MRN: [id number]  Date of admission: 12/10/2018 Delivering MD: Lazaro Arms   Date of discharge: 12/13/2018  Admitting diagnosis: ctx Intrauterine pregnancy: Unknown     Secondary diagnosis:  Active Problems:   Eclampsia   S/P cesarean section  Discharge diagnosis: Preterm Pregnancy Delivered                                                                                                Hospital course:  Sceduled C/S   23 y.o. yo G1P1 at Unknown was admitted to the hospital 12/10/2018 for scheduled cesarean section with the following indication:Non-Reassuring FHR.  Membrane Rupture Time/Date: 8:12 AM ,12/10/2018   Patient delivered a Viable infant.12/10/2018  Details of operation can be found in separate operative note.  Pateint had an uncomplicated postpartum course.  She is ambulating, tolerating a regular diet, passing flatus, and urinating well. Patient is discharged home in stable condition on  12/13/18. Patient received magnesium sulfate for seizure prophylaxis. She was started on enalapril, norvasc and HCTZ postpartum         Physical exam  Vitals:   12/13/18 0815 12/13/18 0818 12/13/18 0855 12/13/18 0950  BP: (!) 161/100 (!) 166/104 (!) 161/100 (!) 154/96  Pulse: (!) 106 100 (!) 104 (!) 103  Resp:  18    Temp:  98.5 F (36.9 C)    TempSrc:  Oral    SpO2:  97%    Weight:      Height:       General: alert, cooperative and no distress Lochia: appropriate Uterine Fundus: firm Incision: Dressing is clean, dry, and intact DVT Evaluation: No evidence of DVT seen on physical exam. Labs: Lab Results  Component Value Date   WBC 16.4 (H) 12/11/2018   HGB 11.6 (L) 12/11/2018   HCT 34.2 (L) 12/11/2018   MCV 97.4 12/11/2018   PLT 148 (L) 12/11/2018   CMP Latest Ref Rng & Units 12/10/2018  Glucose 70 - 99 mg/dL 99  BUN 6 - 20 mg/dL 11  Creatinine 4.09 - 8.11 mg/dL 9.14(N)  Sodium 829 - 562 mmol/L  139  Potassium 3.5 - 5.1 mmol/L 3.4(L)  Chloride 98 - 111 mmol/L 109  CO2 22 - 32 mmol/L 17(L)  Calcium 8.9 - 10.3 mg/dL 8.9  Total Protein 6.5 - 8.1 g/dL 1.3(Y)  Total Bilirubin 0.3 - 1.2 mg/dL 0.5  Alkaline Phos 38 - 126 U/L 108  AST 15 - 41 U/L 53(H)  ALT 0 - 44 U/L 27    Discharge instruction: per After Visit Summary and "Baby and Me Booklet".  After visit meds:  Allergies as of 12/13/2018   No Known Allergies     Medication List    STOP taking these medications   acyclovir 400 MG tablet Commonly known as:  ZOVIRAX Replaced by:  acyclovir 200 MG capsule     TAKE these medications   acyclovir 200 MG capsule Commonly known as:  ZOVIRAX Take 2 capsules (400 mg total) by mouth 2 (two) times daily. Replaces:  acyclovir 400  MG tablet   amLODipine 10 MG tablet Commonly known as:  NORVASC Take 1 tablet (10 mg total) by mouth daily.   enalapril 20 MG tablet Commonly known as:  VASOTEC Take 1 tablet (20 mg total) by mouth 2 (two) times daily.   hydrochlorothiazide 25 MG tablet Commonly known as:  HYDRODIURIL Take 1 tablet (25 mg total) by mouth daily.   ibuprofen 600 MG tablet Commonly known as:  ADVIL Take 1 tablet (600 mg total) by mouth every 6 (six) hours as needed.   oxyCODONE-acetaminophen 5-325 MG tablet Commonly known as:  PERCOCET/ROXICET Take 1 tablet by mouth every 4 (four) hours as needed for moderate pain.   prenatal multivitamin Tabs tablet Take 1 tablet by mouth daily at 12 noon.       Diet: routine diet  Activity: Advance as tolerated. Pelvic rest for 6 weeks.   Outpatient follow up: Patient will follow up in the office for BP and incision check Follow up Appt:No future appointments. Follow up Visit:No follow-ups on file.  Postpartum contraception: Undecided  Newborn Data: Live born female  Birth Weight: 4 lb 2.3 oz (1880 g) APGAR: 8, 10  Newborn Delivery   Birth date/time:  12/10/2018 08:12:00 Delivery type:  C-Section, Low  Transverse Trial of labor:  No C-section categorization:  Primary     Baby Feeding: Bottle and Breast Disposition:NICU   12/13/2018 Catalina AntiguaPeggy Jasen Hartstein, MD

## 2018-12-14 ENCOUNTER — Encounter (HOSPITAL_COMMUNITY): Payer: Self-pay | Admitting: *Deleted

## 2018-12-14 ENCOUNTER — Other Ambulatory Visit: Payer: Self-pay

## 2018-12-14 ENCOUNTER — Encounter (HOSPITAL_COMMUNITY): Payer: Self-pay

## 2018-12-14 ENCOUNTER — Emergency Department (HOSPITAL_COMMUNITY)
Admission: EM | Admit: 2018-12-14 | Discharge: 2018-12-14 | Disposition: A | Discharge status: Home / Self Care | Payer: Managed Care, Other (non HMO) | Attending: Emergency Medicine | Admitting: Emergency Medicine

## 2018-12-14 ENCOUNTER — Inpatient Hospital Stay (EMERGENCY_DEPARTMENT_HOSPITAL)
Admission: AD | Admit: 2018-12-14 | Discharge: 2018-12-14 | Disposition: A | Discharge status: Home / Self Care | Payer: Managed Care, Other (non HMO) | Source: Home / Self Care | Attending: Obstetrics and Gynecology | Admitting: Obstetrics and Gynecology

## 2018-12-14 DIAGNOSIS — Z9889 Other specified postprocedural states: Secondary | ICD-10-CM | POA: Insufficient documentation

## 2018-12-14 DIAGNOSIS — H538 Other visual disturbances: Secondary | ICD-10-CM | POA: Diagnosis not present

## 2018-12-14 DIAGNOSIS — O34219 Maternal care for unspecified type scar from previous cesarean delivery: Secondary | ICD-10-CM

## 2018-12-14 DIAGNOSIS — O1495 Unspecified pre-eclampsia, complicating the puerperium: Secondary | ICD-10-CM | POA: Diagnosis not present

## 2018-12-14 DIAGNOSIS — O165 Unspecified maternal hypertension, complicating the puerperium: Secondary | ICD-10-CM

## 2018-12-14 DIAGNOSIS — R51 Headache: Secondary | ICD-10-CM | POA: Diagnosis present

## 2018-12-14 DIAGNOSIS — Z79899 Other long term (current) drug therapy: Secondary | ICD-10-CM

## 2018-12-14 HISTORY — DX: Other specified health status: Z78.9

## 2018-12-14 LAB — URINALYSIS, ROUTINE W REFLEX MICROSCOPIC
Bilirubin Urine: NEGATIVE
Glucose, UA: NEGATIVE mg/dL
Ketones, ur: NEGATIVE mg/dL
Nitrite: NEGATIVE
Protein, ur: 100 mg/dL — AB
Specific Gravity, Urine: 1.005 (ref 1.005–1.030)
pH: 7 (ref 5.0–8.0)

## 2018-12-14 LAB — TYPE AND SCREEN
ABO/RH(D): A NEG
Antibody Screen: POSITIVE
Unit division: 0
Unit division: 0

## 2018-12-14 LAB — CBC WITH DIFFERENTIAL/PLATELET
Abs Immature Granulocytes: 0.06 10*3/uL (ref 0.00–0.07)
Basophils Absolute: 0 10*3/uL (ref 0.0–0.1)
Basophils Relative: 0 %
Eosinophils Absolute: 0.4 10*3/uL (ref 0.0–0.5)
Eosinophils Relative: 4 %
HCT: 31.9 % — ABNORMAL LOW (ref 36.0–46.0)
Hemoglobin: 11 g/dL — ABNORMAL LOW (ref 12.0–15.0)
Immature Granulocytes: 1 %
Lymphocytes Relative: 18 %
Lymphs Abs: 1.7 10*3/uL (ref 0.7–4.0)
MCH: 34.2 pg — ABNORMAL HIGH (ref 26.0–34.0)
MCHC: 34.5 g/dL (ref 30.0–36.0)
MCV: 99.1 fL (ref 80.0–100.0)
Monocytes Absolute: 0.6 10*3/uL (ref 0.1–1.0)
Monocytes Relative: 7 %
Neutro Abs: 6.8 10*3/uL (ref 1.7–7.7)
Neutrophils Relative %: 70 %
Platelets: 141 10*3/uL — ABNORMAL LOW (ref 150–400)
RBC: 3.22 MIL/uL — ABNORMAL LOW (ref 3.87–5.11)
RDW: 12.1 % (ref 11.5–15.5)
WBC: 9.6 10*3/uL (ref 4.0–10.5)
nRBC: 0 % (ref 0.0–0.2)

## 2018-12-14 LAB — COMPREHENSIVE METABOLIC PANEL
ALT: 31 U/L (ref 0–44)
AST: 44 U/L — ABNORMAL HIGH (ref 15–41)
Albumin: 2.4 g/dL — ABNORMAL LOW (ref 3.5–5.0)
Alkaline Phosphatase: 78 U/L (ref 38–126)
Anion gap: 11 (ref 5–15)
BUN: 14 mg/dL (ref 6–20)
CO2: 26 mmol/L (ref 22–32)
Calcium: 8.6 mg/dL — ABNORMAL LOW (ref 8.9–10.3)
Chloride: 101 mmol/L (ref 98–111)
Creatinine, Ser: 1.16 mg/dL — ABNORMAL HIGH (ref 0.44–1.00)
GFR calc Af Amer: >60 mL/min (ref >60)
GFR calc non Af Amer: >60 mL/min (ref >60)
Glucose, Bld: 91 mg/dL (ref 70–99)
Potassium: 3 mmol/L — ABNORMAL LOW (ref 3.5–5.1)
Sodium: 138 mmol/L (ref 135–145)
Total Bilirubin: 0.7 mg/dL (ref 0.3–1.2)
Total Protein: 5.2 g/dL — ABNORMAL LOW (ref 6.5–8.1)

## 2018-12-14 LAB — BPAM RBC
Blood Product Expiration Date: 202005192359
Blood Product Expiration Date: 202005192359
Unit Type and Rh: 600
Unit Type and Rh: 600

## 2018-12-14 MED ORDER — PROCHLORPERAZINE EDISYLATE 10 MG/2ML IJ SOLN
10.0000 mg | Freq: Once | INTRAMUSCULAR | Status: AC
  Administered 2018-12-14: 04:00:00 10 mg via INTRAVENOUS
  Filled 2018-12-14: qty 2

## 2018-12-14 MED ORDER — ENALAPRIL MALEATE 10 MG PO TABS
20.0000 mg | ORAL_TABLET | Freq: Once | ORAL | Status: AC
  Administered 2018-12-14: 09:00:00 20 mg via ORAL
  Filled 2018-12-14: qty 1
  Filled 2018-12-14: qty 2

## 2018-12-14 MED ORDER — HYDROCHLOROTHIAZIDE 25 MG PO TABS
25.0000 mg | ORAL_TABLET | Freq: Every day | ORAL | Status: DC
  Administered 2018-12-14: 25 mg via ORAL
  Filled 2018-12-14: qty 1

## 2018-12-14 MED ORDER — SODIUM CHLORIDE 0.9 % IV BOLUS (SEPSIS)
1000.0000 mL | Freq: Once | INTRAVENOUS | Status: AC
  Administered 2018-12-14: 04:00:00 1000 mL via INTRAVENOUS

## 2018-12-14 MED ORDER — AMLODIPINE BESYLATE 5 MG PO TABS
10.0000 mg | ORAL_TABLET | Freq: Once | ORAL | Status: AC
  Administered 2018-12-14: 06:00:00 10 mg via ORAL
  Filled 2018-12-14: qty 2

## 2018-12-14 MED ORDER — ENALAPRIL MALEATE 20 MG PO TABS
20.0000 mg | ORAL_TABLET | Freq: Once | ORAL | Status: AC
  Administered 2018-12-14: 20 mg via ORAL
  Filled 2018-12-14: qty 1

## 2018-12-14 MED ORDER — HYDROCHLOROTHIAZIDE 25 MG PO TABS
25.0000 mg | ORAL_TABLET | Freq: Every day | ORAL | Status: DC
  Administered 2018-12-14: 06:00:00 25 mg via ORAL
  Filled 2018-12-14: qty 1

## 2018-12-14 MED ORDER — POTASSIUM CHLORIDE CRYS ER 20 MEQ PO TBCR
40.0000 meq | EXTENDED_RELEASE_TABLET | Freq: Once | ORAL | Status: AC
  Administered 2018-12-14: 06:00:00 40 meq via ORAL
  Filled 2018-12-14: qty 2

## 2018-12-14 NOTE — Discharge Instructions (Addendum)
Please follow-up with your OB/GYN as scheduled.  Please continue your blood pressure medications as prescribed.  Your labs today were reassuring.  You did have a small amount of protein in your urine.  We discussed her case with OB/GYN on-call who wanted you to continue your medications that have been prescribed and follow-up with your OB/GYN to have your blood pressure checked in 1 week.  If you develop severe headache that is unrelieved with Tylenol or ibuprofen at home, changes in your vision that are new, numbness or weakness on one side of your body, changes in speech, chest pain or shortness of breath, severe right upper abdominal pain, seizures, please return to the emergency department.

## 2018-12-14 NOTE — ED Triage Notes (Signed)
Pt from home w/ a c/o a headache and hypertension. Pt was recently admitted to the hospital on the 27th for tx of a seizure related to eclampsia. She gave birth on the 27th.  This was her first pregnancy. Her headache has been ongoing for the past 2-3 hours. Her vision is blurry. No issues with ambulation.   Home BP 168/122

## 2018-12-14 NOTE — Discharge Instructions (Signed)
DASH Eating Plan DASH stands for "Dietary Approaches to Stop Hypertension." The DASH eating plan is a healthy eating plan that has been shown to reduce high blood pressure (hypertension). It may also reduce your risk for type 2 diabetes, heart disease, and stroke. The DASH eating plan may also help with weight loss. What are tips for following this plan?  General guidelines  Avoid eating more than 2,300 mg (milligrams) of salt (sodium) a day. If you have hypertension, you may need to reduce your sodium intake to 1,500 mg a day.  Limit alcohol intake to no more than 1 drink a day for nonpregnant women and 2 drinks a day for men. One drink equals 12 oz of beer, 5 oz of wine, or 1 oz of hard liquor.  Work with your health care provider to maintain a healthy body weight or to lose weight. Ask what an ideal weight is for you.  Get at least 30 minutes of exercise that causes your heart to beat faster (aerobic exercise) most days of the week. Activities may include walking, swimming, or biking.  Work with your health care provider or diet and nutrition specialist (dietitian) to adjust your eating plan to your individual calorie needs. Reading food labels   Check food labels for the amount of sodium per serving. Choose foods with less than 5 percent of the Daily Value of sodium. Generally, foods with less than 300 mg of sodium per serving fit into this eating plan.  To find whole grains, look for the word "whole" as the first word in the ingredient list. Shopping  Buy products labeled as "low-sodium" or "no salt added."  Buy fresh foods. Avoid canned foods and premade or frozen meals. Cooking  Avoid adding salt when cooking. Use salt-free seasonings or herbs instead of table salt or sea salt. Check with your health care provider or pharmacist before using salt substitutes.  Do not fry foods. Cook foods using healthy methods such as baking, boiling, grilling, and broiling instead.  Cook with  heart-healthy oils, such as olive, canola, soybean, or sunflower oil. Meal planning  Eat a balanced diet that includes: ? 5 or more servings of fruits and vegetables each day. At each meal, try to fill half of your plate with fruits and vegetables. ? Up to 6-8 servings of whole grains each day. ? Less than 6 oz of lean meat, poultry, or fish each day. A 3-oz serving of meat is about the same size as a deck of cards. One egg equals 1 oz. ? 2 servings of low-fat dairy each day. ? A serving of nuts, seeds, or beans 5 times each week. ? Heart-healthy fats. Healthy fats called Omega-3 fatty acids are found in foods such as flaxseeds and coldwater fish, like sardines, salmon, and mackerel.  Limit how much you eat of the following: ? Canned or prepackaged foods. ? Food that is high in trans fat, such as fried foods. ? Food that is high in saturated fat, such as fatty meat. ? Sweets, desserts, sugary drinks, and other foods with added sugar. ? Full-fat dairy products.  Do not salt foods before eating.  Try to eat at least 2 vegetarian meals each week.  Eat more home-cooked food and less restaurant, buffet, and fast food.  When eating at a restaurant, ask that your food be prepared with less salt or no salt, if possible. What foods are recommended? The items listed may not be a complete list. Talk with your dietitian about   what dietary choices are best for you. Grains Whole-grain or whole-wheat bread. Whole-grain or whole-wheat pasta. Brown rice. Oatmeal. Quinoa. Bulgur. Whole-grain and low-sodium cereals. Pita bread. Low-fat, low-sodium crackers. Whole-wheat flour tortillas. Vegetables Fresh or frozen vegetables (raw, steamed, roasted, or grilled). Low-sodium or reduced-sodium tomato and vegetable juice. Low-sodium or reduced-sodium tomato sauce and tomato paste. Low-sodium or reduced-sodium canned vegetables. Fruits All fresh, dried, or frozen fruit. Canned fruit in natural juice (without  added sugar). Meat and other protein foods Skinless chicken or turkey. Ground chicken or turkey. Pork with fat trimmed off. Fish and seafood. Egg whites. Dried beans, peas, or lentils. Unsalted nuts, nut butters, and seeds. Unsalted canned beans. Lean cuts of beef with fat trimmed off. Low-sodium, lean deli meat. Dairy Low-fat (1%) or fat-free (skim) milk. Fat-free, low-fat, or reduced-fat cheeses. Nonfat, low-sodium ricotta or cottage cheese. Low-fat or nonfat yogurt. Low-fat, low-sodium cheese. Fats and oils Soft margarine without trans fats. Vegetable oil. Low-fat, reduced-fat, or light mayonnaise and salad dressings (reduced-sodium). Canola, safflower, olive, soybean, and sunflower oils. Avocado. Seasoning and other foods Herbs. Spices. Seasoning mixes without salt. Unsalted popcorn and pretzels. Fat-free sweets. What foods are not recommended? The items listed may not be a complete list. Talk with your dietitian about what dietary choices are best for you. Grains Baked goods made with fat, such as croissants, muffins, or some breads. Dry pasta or rice meal packs. Vegetables Creamed or fried vegetables. Vegetables in a cheese sauce. Regular canned vegetables (not low-sodium or reduced-sodium). Regular canned tomato sauce and paste (not low-sodium or reduced-sodium). Regular tomato and vegetable juice (not low-sodium or reduced-sodium). Pickles. Olives. Fruits Canned fruit in a light or heavy syrup. Fried fruit. Fruit in cream or butter sauce. Meat and other protein foods Fatty cuts of meat. Ribs. Fried meat. Bacon. Sausage. Bologna and other processed lunch meats. Salami. Fatback. Hotdogs. Bratwurst. Salted nuts and seeds. Canned beans with added salt. Canned or smoked fish. Whole eggs or egg yolks. Chicken or turkey with skin. Dairy Whole or 2% milk, cream, and half-and-half. Whole or full-fat cream cheese. Whole-fat or sweetened yogurt. Full-fat cheese. Nondairy creamers. Whipped toppings.  Processed cheese and cheese spreads. Fats and oils Butter. Stick margarine. Lard. Shortening. Ghee. Bacon fat. Tropical oils, such as coconut, palm kernel, or palm oil. Seasoning and other foods Salted popcorn and pretzels. Onion salt, garlic salt, seasoned salt, table salt, and sea salt. Worcestershire sauce. Tartar sauce. Barbecue sauce. Teriyaki sauce. Soy sauce, including reduced-sodium. Steak sauce. Canned and packaged gravies. Fish sauce. Oyster sauce. Cocktail sauce. Horseradish that you find on the shelf. Ketchup. Mustard. Meat flavorings and tenderizers. Bouillon cubes. Hot sauce and Tabasco sauce. Premade or packaged marinades. Premade or packaged taco seasonings. Relishes. Regular salad dressings. Where to find more information:  National Heart, Lung, and Blood Institute: www.nhlbi.nih.gov  American Heart Association: www.heart.org Summary  The DASH eating plan is a healthy eating plan that has been shown to reduce high blood pressure (hypertension). It may also reduce your risk for type 2 diabetes, heart disease, and stroke.  With the DASH eating plan, you should limit salt (sodium) intake to 2,300 mg a day. If you have hypertension, you may need to reduce your sodium intake to 1,500 mg a day.  When on the DASH eating plan, aim to eat more fresh fruits and vegetables, whole grains, lean proteins, low-fat dairy, and heart-healthy fats.  Work with your health care provider or diet and nutrition specialist (dietitian) to adjust your eating plan to your   individual calorie needs. This information is not intended to replace advice given to you by your health care provider. Make sure you discuss any questions you have with your health care provider. Document Released: 07/21/2011 Document Revised: 07/25/2016 Document Reviewed: 07/25/2016 Elsevier Interactive Patient Education  2019 Elsevier Inc.  How to Take Your Blood Pressure You can take your blood pressure at home with a machine. You  may need to check your blood pressure at home:  To check if you have high blood pressure (hypertension).  To check your blood pressure over time.  To make sure your blood pressure medicine is working. Supplies needed: You will need a blood pressure machine, or monitor. You can buy one at a drugstore or online. When choosing one:  Choose one with an arm cuff.  Choose one that wraps around your upper arm. Only one finger should fit between your arm and the cuff.  Do not choose one that measures your blood pressure from your wrist or finger. Your doctor can suggest a monitor. How to prepare Avoid these things for 30 minutes before checking your blood pressure:  Drinking caffeine.  Drinking alcohol.  Eating.  Smoking.  Exercising. Five minutes before checking your blood pressure:  Pee.  Sit in a dining chair. Avoid sitting in a soft couch or armchair.  Be quiet. Do not talk. How to take your blood pressure Follow the instructions that came with your machine. If you have a digital blood pressure monitor, these may be the instructions: 1. Sit up straight. 2. Place your feet on the floor. Do not cross your ankles or legs. 3. Rest your left arm at the level of your heart. You may rest it on a table, desk, or chair. 4. Pull up your shirt sleeve. 5. Wrap the blood pressure cuff around the upper part of your left arm. The cuff should be 1 inch (2.5 cm) above your elbow. It is best to wrap the cuff around bare skin. 6. Fit the cuff snugly around your arm. You should be able to place only one finger between the cuff and your arm. 7. Put the cord inside the groove of your elbow. 8. Press the power button. 9. Sit quietly while the cuff fills with air and loses air. 10. Write down the numbers on the screen. 11. Wait 2-3 minutes and then repeat steps 1-10. What do the numbers mean? Two numbers make up your blood pressure. The first number is called systolic pressure. The second is  called diastolic pressure. An example of a blood pressure reading is "120 over 80" (or 120/80). If you are an adult and do not have a medical condition, use this guide to find out if your blood pressure is normal: Normal  First number: below 120.  Second number: below 80. Elevated  First number: 120-129.  Second number: below 80. Hypertension stage 1  First number: 130-139.  Second number: 80-89. Hypertension stage 2  First number: 140 or above.  Second number: 90 or above. Your blood pressure is above normal even if only the top or bottom number is above normal. Follow these instructions at home:  Check your blood pressure as often as your doctor tells you to.  Take your monitor to your next doctor's appointment. Your doctor will: ? Make sure you are using it correctly. ? Make sure it is working right.  Make sure you understand what your blood pressure numbers should be.  Tell your doctor if your medicines are causing side   effects. Contact a doctor if:  Your blood pressure keeps being high. Get help right away if:  Your first blood pressure number is higher than 180.  Your second blood pressure number is higher than 120. This information is not intended to replace advice given to you by your health care provider. Make sure you discuss any questions you have with your health care provider. Document Released: 07/14/2008 Document Revised: 06/29/2016 Document Reviewed: 01/08/2016 Elsevier Interactive Patient Education  2019 Elsevier Inc.  

## 2018-12-14 NOTE — MAU Provider Note (Signed)
History     CSN: [id number]  Arrival date and time: 12/14/18 1412   First Provider Initiated Contact with Patient 12/14/18 1636      Chief Complaint  Patient presents with  . Hypertension   Margaret Reyes is a 23 y.o. G1P1001 at 2 days postpartum who presents for Hypertension. She states she returned from the ED this morning and took her blood pressure.      OB History    Gravida  1   Para  1   Term      Preterm      AB      Living  1     SAB      TAB      Ectopic      Multiple  0   Live Births  1           Past Medical History:  Diagnosis Date  . Medical history non-contributory     Past Surgical History:  Procedure Laterality Date  . CESAREAN SECTION N/A 12/10/2018   Procedure: CESAREAN SECTION;  Surgeon: Lazaro ArmsEure, Luther H, MD;  Location: [facility] ORS;  Service: Obstetrics;  Laterality: N/A;    No family history on file.  Social History   Tobacco Use  . Smoking status: Never Smoker  . Smokeless tobacco: Never Used  Substance Use Topics  . Alcohol use: Not Currently    Comment: occassionally  . Drug use: Never    Allergies: No Known Allergies  Medications Prior to Admission  Medication Sig Dispense Refill Last Dose  . acyclovir (ZOVIRAX) 200 MG capsule Take 2 capsules (400 mg total) by mouth 2 (two) times daily. 30 capsule 6 12/14/2018 at Unknown time  . ibuprofen (ADVIL) 600 MG tablet Take 1 tablet (600 mg total) by mouth every 6 (six) hours as needed. (Patient taking differently: Take 600 mg by mouth every 6 (six) hours as needed for mild pain. ) 60 tablet 3 12/14/2018 at Unknown time  . oxyCODONE-acetaminophen (PERCOCET/ROXICET) 5-325 MG tablet Take 1 tablet by mouth every 4 (four) hours as needed for moderate pain. 20 tablet 0 12/13/2018 at Unknown time  . Prenatal Vit-Fe Fumarate-FA (PRENATAL MULTIVITAMIN) TABS tablet Take 1 tablet by mouth daily at 12 noon.   12/13/2018 at Unknown time  . amLODipine (NORVASC) 10 MG tablet Take 1 tablet (10  mg total) by mouth daily. 30 tablet 0   . enalapril (VASOTEC) 20 MG tablet Take 1 tablet (20 mg total) by mouth 2 (two) times daily. 30 tablet 0   . hydrochlorothiazide (HYDRODIURIL) 25 MG tablet Take 1 tablet (25 mg total) by mouth daily. 30 tablet 0     Review of Systems  Constitutional: Negative for chills and fever.  Respiratory: Negative for cough and shortness of breath.   Gastrointestinal: Negative for constipation, diarrhea, nausea and vomiting.  Genitourinary: Negative for dysuria.  Neurological: Negative for dizziness, light-headedness and headaches.   Physical Exam   Blood pressure (!) 159/93, pulse 90, temperature 98.4 F (36.9 C), temperature source Oral, resp. rate 18, SpO2 100 %, currently breastfeeding. Vitals:   12/14/18 1616 12/14/18 1631  BP: (!) 156/97 (!) 159/93  Pulse: 97 90  Resp:    Temp:    SpO2:      Physical Exam  Constitutional: She is oriented to person, place, and time. She appears well-developed and well-nourished. No distress.  HENT:  Head: Normocephalic and atraumatic.  Eyes: Conjunctivae are normal.  Neck: Normal range of motion.  Respiratory: Effort  normal.  GI: Soft.  Musculoskeletal: Normal range of motion.  Neurological: She is alert and oriented to person, place, and time.  Skin: Skin is warm and dry.  Psychiatric: She has a normal mood and affect. Her behavior is normal.    MAU Course  Procedures  MDM Education Antihypertensives Assessment and Plan  23 year old Postpartum Elevated BP  -Discussed proper management of blood pressure including when and how to take. -Educated on medication usage and need for blood pressure measurements ~ 2 hours after medication administration if symptomatic. -Informed of need for follow up, in office, for blood pressure check and medication response. -Patient expresses desire to receive care at [facility] instead of [facility]. -Instructed on need for blood pressure check on Tuesday, but to take  blood pressure, at home, on Monday. -Patient without questions, but requests that provider write down all instructions and information for follow up. -Will given HCTZ and Enalapril now  Follow Up (6:04 PM)  -Medications given -Discharge instructions with how to take bp and proper diet in setting of high blood pressure. -Will send email for scheduling of follow up bp. -No questions or concerns. -Discharged to home in stable condition.  Cherre Robins MSN, CNM 12/14/2018, 4:39 PM

## 2018-12-14 NOTE — MAU Note (Signed)
Was seen in ED this morning.  Hx of seizure at home and in ambulance prior to delivery- 34wks- delivered c/s 4/27. dc'd 4/30.Marland Kitchen  Has not had a seizure since.  BP was elevated so went to ED this morning.  Had blood work done. Is on medications for BP.   Checked bp when got home, after walking upstairs 168/112, called spoke to advice nurse.  Told to come here NOT ER.  Denies HA, blurring is unchanged, denies epigastric pain. Still swollen, but not worse.

## 2018-12-14 NOTE — ED Provider Notes (Signed)
TIME SEEN: 3:00 AM  CHIEF COMPLAINT: Headache, blurry vision  HPI: Patient is a 23 year old female with history of eclampsia who underwent emergency C-section on April 27 who presents to the emergency department with continued left-sided throbbing headache, bilateral blurry vision.  States that she had 3 seizures prior to her emergency delivery for nonreassuring fetal heart tones.  States that she was discharged and continued to have headaches intermittently.  She took 600 mg of ibuprofen prior to arrival and headache has improved but is not gone.  States she has had blurry vision that has been constant since even before delivery.  No numbness, tingling or focal weakness.  No vision loss.  Having some abdominal pain as well.  No nausea, vomiting diarrhea.  No fever.  States she feels like her lochia is improving.  No abnormal vaginal discharge.  ROS: See HPI Constitutional: no fever  Eyes: no drainage  ENT: no runny nose   Cardiovascular:  no chest pain  Resp: no SOB  GI: no vomiting GU: no dysuria Integumentary: no rash  Allergy: no hives  Musculoskeletal: no leg swelling  Neurological: no slurred speech ROS otherwise negative  PAST MEDICAL HISTORY/PAST SURGICAL HISTORY:  History reviewed. No pertinent past medical history.  MEDICATIONS:  Prior to Admission medications   Medication Sig Start Date End Date Taking? Authorizing Provider  acyclovir (ZOVIRAX) 200 MG capsule Take 2 capsules (400 mg total) by mouth 2 (two) times daily. 12/13/18   Constant, Peggy, MD  amLODipine (NORVASC) 10 MG tablet Take 1 tablet (10 mg total) by mouth daily. 12/13/18   Constant, Peggy, MD  enalapril (VASOTEC) 20 MG tablet Take 1 tablet (20 mg total) by mouth 2 (two) times daily. 12/13/18   Constant, Peggy, MD  hydrochlorothiazide (HYDRODIURIL) 25 MG tablet Take 1 tablet (25 mg total) by mouth daily. 12/13/18   Constant, Peggy, MD  ibuprofen (ADVIL) 600 MG tablet Take 1 tablet (600 mg total) by mouth every 6  (six) hours as needed. 12/13/18   Constant, Peggy, MD  oxyCODONE-acetaminophen (PERCOCET/ROXICET) 5-325 MG tablet Take 1 tablet by mouth every 4 (four) hours as needed for moderate pain. 12/13/18   Constant, Peggy, MD  Prenatal Vit-Fe Fumarate-FA (PRENATAL MULTIVITAMIN) TABS tablet Take 1 tablet by mouth daily at 12 noon.    [provider]    ALLERGIES:  No Known Allergies  SOCIAL HISTORY:  Social History   Tobacco Use  . Smoking status: Never Smoker  . Smokeless tobacco: Never Used  Substance Use Topics  . Alcohol use: Not Currently    Comment: occassionally    FAMILY HISTORY: History reviewed. No pertinent family history.  EXAM: BP (!) 150/102   Pulse 93   Temp 98.9 F (37.2 C) (Oral)   Resp 14   Ht 5\' 3"  (1.6 m)   SpO2 98%   BMI 49.09 kg/m  CONSTITUTIONAL: Alert and oriented and responds appropriately to questions. Well-appearing; well-nourished; obese, in no distress HEAD: Normocephalic, atraumatic EYES: Conjunctivae clear, pupils appear equal, EOMI ENT: normal nose; moist mucous membranes NECK: Supple, no meningismus, no nuchal rigidity, no LAD  CARD: RRR; S1 and S2 appreciated; no murmurs, no clicks, no rubs, no gallops RESP: Normal chest excursion without splinting or tachypnea; breath sounds clear and equal bilaterally; no wheezes, no rhonchi, no rales, no hypoxia or respiratory distress, speaking full sentences ABD/GI: Normal bowel sounds; non-distended; soft, tender throughout the abdomen, no rebound, no guarding, no peritoneal signs, no hepatosplenomegaly, lower transverse surgical scar from her C-section that  is clean, dry and intact without bleeding and drainage BACK:  The back appears normal with normal range of motion EXT: Normal ROM in all joints; non-tender to palpation; no edema; normal capillary refill; no cyanosis, no calf tenderness or swelling    SKIN: Normal color for age and race; warm; no rash NEURO: Moves all extremities equally, normal  sensation diffusely, no clonus, 2+ bilateral patellar reflexes, cranial nerves II to XII intact, normal speech, normal gait PSYCH: The patient's mood and manner are appropriate. Grooming and personal hygiene are appropriate.  MEDICAL DECISION MAKING: Patient here with continued headaches, blurry vision after being diagnosed with eclampsia.  She has not had any seizure activity since delivery of her son.  He is mildly hypertensive here with blood pressures in the 140s/80s to 90s.  She has a nonfocal neurologic exam.  No hyperreflexia, clonus.  She does have some right upper quadrant tenderness on exam but is tender throughout her abdomen and just had a C-section.  We will monitor her blood pressure closely as it seems to be coming down on its own.  She is on amlodipine, enalapril and HCTZ at home.  I do not think that she has an intracranial hemorrhage or cavernous sinus thrombosis.  I do not feel she needs emergent head imaging at this time.  I suspect these are continued symptoms from eclampsia.  Will check labs, urine.  Will treat with IV Compazine for headache given she is breast-feeding.  ED PROGRESS:  Hemoglobin is 11.  Potassium slightly low at 3.0 and given she is on HCTZ will give oral replacement today.  LFTs unremarkable other than very mild elevation of her AST.  Platelet count 141,000.  Creatinine mildly elevated at 1.16.  All of these labs are very similar, stable from discharge labs on April 27.  Her blood pressure continues to stay in the 140s to 150s/80s to 90s.  Her headache has completely resolved after Compazine.  When asked about her blurry vision, patient states that this is actually been going on for many weeks.  No vision loss, diplopia.  Urinalysis pending.  Will order her home blood pressure medications that she is due for this morning.  8:30 AM  Spoke to OB/GYN on call for faculty practice.  She knows patient well.  She states that patient's blood pressure was in the 150s/90s on  discharge.  Have advised to continue the medication regimen as prescribed.  She agrees that IV medications to temporize her blood pressure are not recommended given she has had some pressures as low as 140s/80s.  Patient has outpatient follow-up scheduled in 1 week.  Recommended patient continue her medications and follow-up closely as an outpatient.  Discussed return precautions.   At this time, I do not feel there is any life-threatening condition present. I have reviewed and discussed all results (EKG, imaging, lab, urine as appropriate) and exam findings with patient/family. I have reviewed nursing notes and appropriate previous records.  I feel the patient is safe to be discharged home without further emergent workup and can continue workup as an outpatient as needed. Discussed usual and customary return precautions. Patient/family verbalize understanding and are comfortable with this plan.  Outpatient follow-up has been provided as needed. All questions have been answered.    Janard Culp, Layla MawKristen N, DO 12/14/18 [phone]

## 2018-12-14 NOTE — ED Notes (Signed)
ED Provider at bedside. 

## 2018-12-14 NOTE — ED Notes (Signed)
Pt was [redacted] weeks pregnant when the seizure occurred.

## 2018-12-20 ENCOUNTER — Ambulatory Visit: Payer: Self-pay

## 2018-12-20 ENCOUNTER — Other Ambulatory Visit: Payer: Self-pay

## 2018-12-20 VITALS — BP 92/71 | HR 74

## 2018-12-20 DIAGNOSIS — Z0130 Encounter for examination of blood pressure without abnormal findings: Secondary | ICD-10-CM

## 2018-12-20 NOTE — Progress Notes (Signed)
I connected with  Verlin Grills on 12/20/18 at  2:30 PM EDT by telephone and verified that I am speaking with the correct person using two identifiers.   I discussed the limitations, risks, security and privacy concerns of performing an evaluation and management service by telephone and the availability of in person appointments. I also discussed with the patient that there may be a patient responsible charge related to this service. The patient expressed understanding and agreed to proceed.  Pt BP reported via manual cuff on LA 92/71.  Pt reports that she has spots when closes her eyes that look like "three dots with lightening bolts but they just happen without the headaches".  Pt also stated that she sometimes has headaches but they go away with pain medication.  Notified Dr. Jolayne Panther who did not have any new orders.  Verfied with pt her incision check and pp visit that she has scheduled.  I encouraged pt to continue to monitor her BP when she is having sx's.  Pt stated understanding with no further questions.   Ralene Bathe, RN 12/20/2018  2:59 PM

## 2018-12-25 NOTE — Progress Notes (Signed)
I have reviewed the chart and agree with nursing staff's documentation of this patient's encounter.  Catalina Antigua, MD 12/25/2018 9:13 AM

## 2018-12-26 ENCOUNTER — Ambulatory Visit (INDEPENDENT_AMBULATORY_CARE_PROVIDER_SITE_OTHER): Payer: Medicaid Other | Admitting: *Deleted

## 2018-12-26 ENCOUNTER — Other Ambulatory Visit: Payer: Self-pay

## 2018-12-26 VITALS — BP 90/58 | HR 90 | Temp 98.3°F | Ht 63.0 in | Wt 221.2 lb

## 2018-12-26 DIAGNOSIS — Z3202 Encounter for pregnancy test, result negative: Secondary | ICD-10-CM | POA: Diagnosis not present

## 2018-12-26 DIAGNOSIS — Z30013 Encounter for initial prescription of injectable contraceptive: Secondary | ICD-10-CM

## 2018-12-26 DIAGNOSIS — Z5189 Encounter for other specified aftercare: Secondary | ICD-10-CM

## 2018-12-26 LAB — POCT PREGNANCY, URINE: Preg Test, Ur: NEGATIVE

## 2018-12-26 MED ORDER — MEDROXYPROGESTERONE ACETATE 150 MG/ML IM SUSP
150.0000 mg | Freq: Once | INTRAMUSCULAR | Status: AC
  Administered 2018-12-26: 150 mg via INTRAMUSCULAR

## 2018-12-26 NOTE — Progress Notes (Signed)
RN asked CNM to review patient's chart. Patient taking 3 antihypertensives and now having symptomatic hypotension. Consulted with Dr. Ashok Pall due to patient's history of eclampsia and severe BP postpartum. Will have patient stop enalapril today. Discussed with patient taking BP daily and stopping HCTZ in 3 days if BP is <140/90 or if patient continues to have symptomatic hypotension, stopping sooner. If BP remains in good control, will stop Norvasc in 3 days after stopping HCTZ. If BP is greater than 140/90, patient to continue taking BP medication. Will schedule patient for virtual visit in 1 week to monitor BP. Patient verbalized understanding of instructions and will call the office if she has any questions.   Rolm Bookbinder, CNM 12/26/18 3:34 PM

## 2018-12-26 NOTE — Progress Notes (Signed)
Pt states pain is ok and sometimes picks up if pt is too active.  Pt states she has not noticed any s/s of infection.  Honeycomb dressing noted to still be on incision.  Honey come dressing removed and incision throughly examined. Incision noted to have dermabond still on it and pt shown what that dermabond looks like so that she would not be alarmed as the rest came off.  Advised pt not to scratch or pick at the dermabond but to gently wash the incision and the dermabond would gradually come off.  Wound care and s/s of infection reviewed.  Pt states she is experiencing some generalized weakness throughout the day.  She states she experiences lightheadedness and weakness after she takes her 3 BP medications, amlodipine 10mg , enalapril 20mg , and hctz 25mg  in the morning. Reviewed with Cleone Slim CNM who spoke with pt and gave her instructions which pt wrote down.  Verlin Grills here for Depo-Provera  Injection.  Injection administered without complication. Patient will return in 3 months for next injection. Pt pregnancy test negative and pt denies any sexual activity in the past 2 weeks.   Osvaldo Human, RN 12/26/2018  3:15 PM

## 2018-12-27 ENCOUNTER — Ambulatory Visit: Payer: Self-pay | Admitting: Obstetrics and Gynecology

## 2018-12-28 ENCOUNTER — Telehealth: Payer: Self-pay | Admitting: *Deleted

## 2018-12-28 NOTE — Telephone Encounter (Signed)
Margaret Reyes called this am and left a voicemail that she was seen 2 days ago for wound check  and was taken off 1 bp medication . States last night when it was almost time for her night time bp med her bp was low- 74/62 so she called and spoke with the after hours nurse who told her not to take her bp medication and to call her doctor in the am.   I reviewed her chart and called Margaret Reyes.  She had ecclampsia and was taking enlapril, hctz, and norvasc. States on Wednesday at her incision check was told to stop the enalapril.  She states she takes hctz in pm and norvasc in am. She states she did not take hctz at all yesterday. She states her bp earlier today was 102/84 and had not taken norvasc yet. I informed her per Neill,CNM plan was to stop hctz in 3 days or sooner if symptoms. She states she has had light dizziness. I instructed her to stop the hctz and continue norvasc and to make sure she is drinking lots of water/ fluids. I also reviewed with Dr.Ervin and he ordered virtual nurse visit Monday for bp check. Margaret Reyes agreed to 10:40am on Monday 12/31/18.  We also discussed to go to mau s/s of preeclampsia and may call after hours if needed. She voices understanding.

## 2018-12-31 ENCOUNTER — Ambulatory Visit (INDEPENDENT_AMBULATORY_CARE_PROVIDER_SITE_OTHER): Payer: Medicaid Other

## 2018-12-31 ENCOUNTER — Other Ambulatory Visit: Payer: Self-pay

## 2018-12-31 VITALS — BP 113/71

## 2018-12-31 DIAGNOSIS — Z0130 Encounter for examination of blood pressure without abnormal findings: Secondary | ICD-10-CM

## 2018-12-31 NOTE — Progress Notes (Signed)
Pt did virtual visit for BP check s/p provider d/c'd HCTZ medication due to low BP.  Pt reports BP 113/71 LA.  Pt also reports that she stopped taking all of her BP medication including Norvasc about 2-3 days ago.  Notified Dr. Alysia Penna, who recommends that since she has taken her self off all BP medications to continue to not take them and we will reassess at her next BP app scheduled on 01/02/19.  Notified pt of provider's recommendation and that she was only to stop taking the HCTZ according to her last note.  Pt stated understanding with no further questions.    Lambert Keto., RN, BSN 12/31/18

## 2018-12-31 NOTE — Progress Notes (Signed)
Agree with A & P. 

## 2019-01-02 ENCOUNTER — Other Ambulatory Visit: Payer: Self-pay

## 2019-01-02 ENCOUNTER — Telehealth (INDEPENDENT_AMBULATORY_CARE_PROVIDER_SITE_OTHER): Payer: Medicaid Other | Admitting: *Deleted

## 2019-01-02 DIAGNOSIS — Z0130 Encounter for examination of blood pressure without abnormal findings: Secondary | ICD-10-CM

## 2019-01-02 NOTE — Progress Notes (Signed)
I have reviewed this chart and agree with the RN/CMA assessment and management.    K. Meryl Loma [place], M.D. Attending [facility])   

## 2019-01-02 NOTE — Progress Notes (Signed)
Pt states she has headaches occasionally that go away with ibuprofen, that she has blurred vision and some spots in her vision but they are improving and have been present since she had her seizure.  Pt reports BP is 104/71 HR 70.  Pt is no longer taking any medications.  Reviewed with Dr. Earlene Plater and pt advised to keep her postpartum appointment but does not need another BP check at this time. Pt verbalized understanding. Reviewed reasons to contact the office prior to her visit, if needed.

## 2019-01-03 ENCOUNTER — Ambulatory Visit: Payer: Self-pay

## 2019-01-03 NOTE — Lactation Note (Signed)
This note was copied from a baby's chart. Lactation Consultation Note  Patient Name: Margaret Reyes Date: 01/03/2019 Reason for consult: Follow-up assessment;Primapara;1st time breastfeeding;NICU baby;Preterm <34wks;Mother's request(to check flanges - see LC note )  Mom pumping as LC entered the room, and was using #27 Flanges, appeared to be snug. Per mom  Has piercings on the both nipples and when pumping the milk drains side backwards.  LC provided mom with 2 #30 flanges and mentioned to her when her breast aren't has full  To bump back down to the #27's.  LC reviewed the pump and the settings.     Maternal Data    Feeding Feeding Type: Bottle Fed - Breast Milk Nipple Type: Nfant Extra Slow Flow (gold)  LATCH Score                   Interventions Interventions: Breast feeding basics reviewed  Lactation Tools Discussed/Used Tools: Pump;Flanges Flange Size: 27;30;Other (comment)(per mom says her nipples rub with her #27 F at home and the milk leaks backwards ) Breast pump type: Double-Electric Breast Pump Pump Review: Setup, frequency, and cleaning   Consult Status Consult Status: PRN Follow-up type: In-patient    Matilde Sprang Nadya Hopwood 01/03/2019, 7:22 PM

## 2019-01-10 ENCOUNTER — Encounter: Payer: Self-pay | Admitting: Obstetrics and Gynecology

## 2019-01-10 ENCOUNTER — Telehealth (INDEPENDENT_AMBULATORY_CARE_PROVIDER_SITE_OTHER): Payer: Medicaid Other | Admitting: Obstetrics and Gynecology

## 2019-01-10 ENCOUNTER — Other Ambulatory Visit: Payer: Self-pay

## 2019-01-10 ENCOUNTER — Ambulatory Visit: Payer: Self-pay | Admitting: Obstetrics and Gynecology

## 2019-01-10 VITALS — BP 117/72 | HR 90

## 2019-01-10 DIAGNOSIS — O159 Eclampsia, unspecified as to time period: Secondary | ICD-10-CM

## 2019-01-10 DIAGNOSIS — Z98891 History of uterine scar from previous surgery: Secondary | ICD-10-CM

## 2019-01-10 DIAGNOSIS — Z3009 Encounter for other general counseling and advice on contraception: Secondary | ICD-10-CM

## 2019-01-10 DIAGNOSIS — Z9889 Other specified postprocedural states: Secondary | ICD-10-CM

## 2019-01-10 NOTE — Progress Notes (Signed)
TELEHEALTH POSTPARTUM VIRTUAL VIDEO VISIT ENCOUNTER NOTE  [facility]   I connected with Margaret Reyes on 01/10/19 at  9:55 AM EDT by MyChart Video Encounter at home and verified that I am speaking with the correct person using two identifiers.    I discussed the limitations, risks, security and privacy concerns of performing an evaluation and management service by telephone and the availability of in person appointments. I also discussed with the patient that there may be a patient responsible charge related to this service. The patient expressed understanding and agreed to proceed.  Appointment Date: 01/10/2019  Chief Complaint: Postpartum Visit  History of Present Illness: Margaret Reyes is a 23 y.o. Caucasian G1P1 being evaluated for postpartum followup.    She is s/p primary cesarean section on April 27th at 34 weeks for ecclampsia; she was discharged to home on April 30 on 3 blood pressure medications. Pregnancy complicated by Eclampsia. Baby is doing well in NICU, has NGT in place. She stopped taking her BP meds 1 week ago as her BP has been normal, states we told her to continue to take one (d/c two) but she noted that her BP drops very low when she takes the one so she stopped them all.  Reports BP was 177/72 yesterday.  Complains of seeing bright spots in her vision since delivery, reports she sees them pretty much all the time. Denies pain or oozing or open spots at her incision site.   Vaginal bleeding or discharge: No  Intercourse: No  Contraception: Depo-Provera Mode of feeding infant: breast feeding and pumping PP depression s/s: No .  Any bowel or bladder issues: No  Pap smear: no abnormalities 10/2017 (in CareEverywhere)   Review of Systems: Positive for n/a. Her 12 point review of systems is negative or as noted in the History of Present Illness.  Patient Active Problem List   Diagnosis Date Noted  .  Eclampsia 12/10/2018  . S/P cesarean section 12/10/2018    Medications Margaret Reyes had no medications administered during this visit. Current Outpatient Medications  Medication Sig Dispense Refill  . acyclovir (ZOVIRAX) 200 MG capsule Take 2 capsules (400 mg total) by mouth 2 (two) times daily. 30 capsule 6  . ibuprofen (ADVIL) 600 MG tablet Take 1 tablet (600 mg total) by mouth every 6 (six) hours as needed. (Patient taking differently: Take 600 mg by mouth every 6 (six) hours as needed for mild pain. ) 60 tablet 3  . oxyCODONE-acetaminophen (PERCOCET/ROXICET) 5-325 MG tablet Take 1 tablet by mouth every 4 (four) hours as needed for moderate pain. 20 tablet 0  . Prenatal Vit-Fe Fumarate-FA (PRENATAL MULTIVITAMIN) TABS tablet Take 1 tablet by mouth daily at 12 noon.     No current facility-administered medications for this visit.     Allergies Patient has no known allergies.  Physical Exam:  BP 117/72   Pulse 90   Breastfeeding Yes   General:  Alert, oriented and cooperative. Patient is in no acute distress.  Mental Status: Normal mood and affect. Normal behavior. Normal judgment and thought content.   Respiratory: Normal respiratory effort noted, no problems with respiration noted  Rest of physical exam deferred due to type of encounter  PP Depression Screening:   Edinburgh Postnatal Depression Scale - 01/10/19 1016      Edinburgh Postnatal Depression Scale:  In the Past 7 Days   I have been able to laugh and see the funny side  of things.  0    I have looked forward with enjoyment to things.  0    I have blamed myself unnecessarily when things went wrong.  0    I have been anxious or worried for no good reason.  0    I have felt scared or panicky for no good reason.  0    Things have been getting on top of me.  0    I have been so unhappy that I have had difficulty sleeping.  0    I have felt sad or miserable.  0    I have been so unhappy that I have been crying.  0     The thought of harming myself has occurred to me.  0    Edinburgh Postnatal Depression Scale Total  0      Assessment:Patient is a 23 y.o. G1P1 who is 4 weeks postpartum from a primary cesarean section for eclampsia.  She is doing well, still having bright spots in her vision all the time. No headaches. No longer on anti-hypertensives, normal BP.  Plan: 1. Postoperative state Doing well  2. S/P C-section Doing well  3. Encounter for counseling regarding contraception - Got depo 12/26/18, would like to continue - will have her schedule next appt  4. Eclampsia - Still having bright spots in vision all the time, no other deficits noted - will refer to neurology for f/u - reviewed she needs to see high risk OB/GYN for any future pregnancies, she would like to continue with [facility]  RTC 3 months for depo RTC 3-4 months for annual  I discussed the assessment and treatment plan with the patient. The patient was provided an opportunity to ask questions and all were answered. The patient agreed with the plan and demonstrated an understanding of the instructions.   The patient was advised to call back or seek an in-person evaluation/go to the ED for any concerning postpartum symptoms.  I provided 22 minutes of face-to-face time during this encounter.   Conan Bowens, MD [facility], [facility]

## 2019-01-16 ENCOUNTER — Telehealth (INDEPENDENT_AMBULATORY_CARE_PROVIDER_SITE_OTHER): Payer: Medicaid Other | Admitting: Neurology

## 2019-01-16 ENCOUNTER — Encounter: Payer: Self-pay | Admitting: Neurology

## 2019-01-16 ENCOUNTER — Other Ambulatory Visit: Payer: Self-pay

## 2019-01-16 VITALS — BP 91/66 | Ht 63.0 in

## 2019-01-16 DIAGNOSIS — H53413 Scotoma involving central area, bilateral: Secondary | ICD-10-CM | POA: Diagnosis not present

## 2019-01-16 DIAGNOSIS — O159 Eclampsia, unspecified as to time period: Secondary | ICD-10-CM

## 2019-01-16 DIAGNOSIS — H539 Unspecified visual disturbance: Secondary | ICD-10-CM

## 2019-01-16 NOTE — Addendum Note (Signed)
Addended by: Dimas Chyle on: 01/16/2019 11:13 AM   Modules accepted: Orders

## 2019-01-16 NOTE — Progress Notes (Signed)
Virtual Visit via Video Note The purpose of this virtual visit is to provide medical care while limiting exposure to the novel coronavirus.    Consent was obtained for video visit:  Yes.   Answered questions that patient had about telehealth interaction:  Yes.   I discussed the limitations, risks, security and privacy concerns of performing an evaluation and management service by telemedicine. I also discussed with the patient that there may be a patient responsible charge related to this service. The patient expressed understanding and agreed to proceed.  Pt location: Home Physician Location: office Name of referring provider:  Conan Bowensavis, Kelly M, MD I connected with Verlin GrillsKarathena Mode at patients initiation/request on 01/16/2019 at  9:30 AM EDT by video enabled telemedicine application and verified that I am speaking with the correct person using two identifiers. Pt MRN:  [id number] Pt DOB:  09-25-1995 Video Participants:  Verlin GrillsKarathena Messamore;  Sinclair GroomsEarl Preston (fiance)   History of Present Illness:  This is a pleasant 23 year old right-handed woman with no significant past medical history until she started having headaches and blurred vision in March 2020. She started having severe headaches a few days prior to her hospitalization on 4/27, when she had 3 witnessed GTCs early that morning with foaming at the mouth, bowel/bladder incontinence. She was admitted for eclampsia at 34 weeks pregnancy and underwent cesarean section, her son Bing NeighborsColton is in the NICU currently. The hypertension has resolved, she is now off antihypertensives, BP today 91/66. She denies any further severe headaches. Her main concern are continued vision changes since April. The blurred vision is better, however she reports constant bright spots in her vision, like bright floaters. She notes seeing a bright spot in the superior field of her right eye, and a bright spot in the center of her vision on the left eye, with a bright spot  moving inside it. She denies any dizziness, dysarthria/dysphagia, focal numbness/tingling/weakness. She has had leg jerks/twitches since childhood, mostly when in bed. She denies any staring/unresponsive episodes, gaps in time, olfactory/gustatory hallucinations. She has chronic back pain, no neck pain. There is no history of febrile convulsions, CNS infections such as meningitis/encephalitis, significant traumatic brain injury, neurosurgical procedures, or family history of seizures.  PAST MEDICAL HISTORY: Past Medical History:  Diagnosis Date  . Eclampsia (toxemia of pregnancy)   . Eclampsia (toxemia of pregnancy)   . Medical history non-contributory     PAST SURGICAL HISTORY: Past Surgical History:  Procedure Laterality Date  . CESAREAN SECTION N/A 12/10/2018   Procedure: CESAREAN SECTION;  Surgeon: Lazaro ArmsEure, Luther H, MD;  Location: [facility] LD ORS;  Service: Obstetrics;  Laterality: N/A;    MEDICATIONS: Current Outpatient Medications on File Prior to Visit  Medication Sig Dispense Refill  . acyclovir (ZOVIRAX) 200 MG capsule Take 2 capsules (400 mg total) by mouth 2 (two) times daily. 30 capsule 6  . ibuprofen (ADVIL) 600 MG tablet Take 1 tablet (600 mg total) by mouth every 6 (six) hours as needed. (Patient taking differently: Take 600 mg by mouth every 6 (six) hours as needed for mild pain. ) 60 tablet 3  . oxyCODONE-acetaminophen (PERCOCET/ROXICET) 5-325 MG tablet Take 1 tablet by mouth every 4 (four) hours as needed for moderate pain. 20 tablet 0  . Prenatal Vit-Fe Fumarate-FA (PRENATAL MULTIVITAMIN) TABS tablet Take 1 tablet by mouth daily at 12 noon.     No current facility-administered medications on file prior to visit.     ALLERGIES: No Known Allergies  FAMILY HISTORY:  Family History  Problem Relation Age of Onset  . Breast cancer Maternal Aunt   . Breast cancer Maternal Grandmother     Observations/Objective:   Vitals:   01/16/19 0855  BP: 91/66  Height: 5\' 3"  (1.6 m)    GEN:  The patient appears stated age and is in NAD.  Neurological examination: Patient is awake, alert, oriented x 3. No aphasia or dysarthria. Intact fluency and comprehension. Remote and recent memory intact. Able to name and repeat. Cranial nerves: Extraocular movements intact with no nystagmus. Gross visual field testing done by fiance did not appear to show any visual field cuts. No facial asymmetry. Motor: moves all extremities symmetrically, at least anti-gravity x 4. No incoordination on finger to nose testing. Gait: narrow-based and steady, able to tandem walk adequately. Negative Romberg test.  Assessment and Plan:   This is a pleasant 23 year old right-handed woman with no significant past medical history until she was admitted for eclampsia last April 2020 with 3 generalized convulsions. 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 No focal symptoms. 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 MRI brain with and without contrast will be ordered. Visual scotomas can also be seen with retinal/optic nerve injury, she will be referred to Ophthalmology for formal eye exam. EEG will be ordered for completion. She will follow-up after tests and knows to call for any changes.   Follow Up Instructions:   -I discussed the assessment and treatment plan with the patient. The patient was provided an opportunity to ask questions and all were answered. The patient agreed with the plan and demonstrated an understanding of the instructions.   The patient was advised to call back or seek an in-person evaluation if the symptoms worsen or if  the condition fails to improve as anticipated.     Van Clines, MD

## 2019-01-20 ENCOUNTER — Ambulatory Visit: Payer: Self-pay

## 2019-01-20 NOTE — Lactation Note (Addendum)
This note was copied from a baby's chart. Lactation Consultation Note  Patient Name: Boy Shrita Thien FOYDX'A Date: 01/20/2019    Baby 93 weeks old and being discharged from NICU. RN called to assist w/ latching but then baby received a bottle and was sleeping with pacifier in mouth when [place] arrived within a short period of time. Mother states he latched successfully 2 days ago. She attempted earlier today and she said he turned his head away. Encouraged mother to continue trying on demand and limit pacifier use.  Recommend allowing baby to be STS.  Licks and nuzzling is good. When he starts to cue, provided instruction on latching. Mother is pumping regularly and has DEBP at home.  Reminded her to take pump parts home. Put in message for OP appt.   LC returned to room to assist w/ latching. Baby receiving bottle upon entering. Attempted latching in cross cradle hold with and without #24NS. Had mother hand express volume into NS prior to latching and provided pillows for support. Baby latched and sustained with #24NS for more than 20 min with intermittent swallows. Suggest mother try first without NS and then apply if needed. Try taking off NS half way through feeding. Feed on demand approximately 8-12 times per day.   Suggest mother keep post pumping until baby is able to sustain latch with each feeding for more than 20 min. Provided mother with #20NS & #24NS.     Maternal Data    Feeding Feeding Type: Formula Nipple Type: Dr. Myra Gianotti [facility]  [facility] Score                   Interventions    Lactation Tools Discussed/Used     Consult Status      Vivianne Master [facility] 01/20/2019, 2:45 PM

## 2019-01-21 ENCOUNTER — Telehealth: Payer: Self-pay | Admitting: *Deleted

## 2019-01-21 NOTE — Telephone Encounter (Signed)
LMVM called to schedule EEG please cal lus back at [phone] with your calendar handy

## 2019-01-28 ENCOUNTER — Ambulatory Visit (INDEPENDENT_AMBULATORY_CARE_PROVIDER_SITE_OTHER): Payer: Medicaid Other | Admitting: Neurology

## 2019-01-28 ENCOUNTER — Other Ambulatory Visit: Payer: Self-pay

## 2019-01-28 DIAGNOSIS — O159 Eclampsia, unspecified as to time period: Secondary | ICD-10-CM | POA: Diagnosis not present

## 2019-01-28 DIAGNOSIS — H53413 Scotoma involving central area, bilateral: Secondary | ICD-10-CM

## 2019-01-28 DIAGNOSIS — H539 Unspecified visual disturbance: Secondary | ICD-10-CM | POA: Diagnosis not present

## 2019-02-04 NOTE — Procedures (Signed)
ELECTROENCEPHALOGRAM REPORT  Date of Study: 01/28/2019  Patient's Name: Margaret Reyes MRN: [id number] Date of Birth: [birth year]  Referring Provider: Dr. Ellouise Newer  Clinical History:   This is a 23 year old woman who had 3 generalized convulsions in April 2020 in the setting of eclampsia. 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 EEG to assess for seizure activity.  Medications: Prenatal vitamins, Zovirax  Technical Summary: A multichannel digital EEG recording measured by the international 10-20 system with electrodes applied with paste and impedances below 5000 ohms performed in our laboratory with EKG monitoring in an awake and asleep patient.  Hyperventilation was not performed. Photic stimulation was performed.  The digital EEG was referentially recorded, reformatted, and digitally filtered in a variety of bipolar and referential montages for optimal display.    Description: The patient is awake and asleep during the recording.  During maximal wakefulness, there is a symmetric, medium voltage 9.5 Hz posterior dominant rhythm that attenuates with eye opening.  The record is symmetric.  During drowsiness and sleep, there is an increase in theta slowing of the background.  Vertex waves and symmetric sleep spindles were seen.  Photic stimulation did not elicit any abnormalities.  There were no epileptiform discharges or electrographic seizures seen.    EKG lead was unremarkable.  Impression: This awake and asleep EEG is normal.    Clinical Correlation: A normal EEG does not exclude a clinical diagnosis of epilepsy.  If further clinical questions remain, prolonged EEG may be helpful.  Clinical correlation is advised.   Ellouise Newer, M.D.

## 2019-02-05 ENCOUNTER — Telehealth: Payer: Self-pay

## 2019-02-05 NOTE — Telephone Encounter (Signed)
Pt called given EEG results verbalized understanding, pts stated MRI is scheduled for July 13th, and that she is going to call to see about an eye dr appointment

## 2019-02-25 ENCOUNTER — Ambulatory Visit
Admission: RE | Admit: 2019-02-25 | Discharge: 2019-02-25 | Disposition: A | Discharge status: Home / Self Care | Payer: Medicaid Other | Source: Ambulatory Visit | Attending: Neurology | Admitting: Neurology

## 2019-02-25 DIAGNOSIS — H539 Unspecified visual disturbance: Secondary | ICD-10-CM

## 2019-02-25 DIAGNOSIS — O159 Eclampsia, unspecified as to time period: Secondary | ICD-10-CM

## 2019-02-25 DIAGNOSIS — H53413 Scotoma involving central area, bilateral: Secondary | ICD-10-CM

## 2019-02-25 MED ORDER — GADOBENATE DIMEGLUMINE 529 MG/ML IV SOLN
20.0000 mL | Freq: Once | INTRAVENOUS | Status: AC | PRN
  Administered 2019-02-25: 20 mL via INTRAVENOUS

## 2019-02-26 ENCOUNTER — Telehealth: Payer: Self-pay

## 2019-02-26 NOTE — Telephone Encounter (Signed)
Left message for pt to return call.

## 2019-02-26 NOTE — Telephone Encounter (Signed)
-----   Message from Karen M Aquino, MD sent at 02/26/2019 12:24 PM EDT ----- Pls let her know the MRI brain was normal. How is she feeling? At this point there is no clear neurological cause for her vision changes, would follow-up with Ophthalmology. Is she still having a lot of the dizziness and twitches? Thanks 

## 2019-02-28 ENCOUNTER — Telehealth: Payer: Self-pay

## 2019-02-28 NOTE — Telephone Encounter (Signed)
-----   Message from Cameron Sprang, MD sent at 02/26/2019 12:24 PM EDT ----- Pls let her know the MRI brain was normal. How is she feeling? At this point there is no clear neurological cause for her vision changes, would follow-up with Ophthalmology. Is she still having a lot of the dizziness and twitches? Thanks

## 2019-02-28 NOTE — Telephone Encounter (Signed)
Pt informed of normal MRI. She has not contacted Ophthalmology but she will.  Twitches are still happening. Worse at night.   Dizziness occurs at night or when she stands up.  Drinks a lot of water. Does not think she is dehydrated.  Taking medications as listed.

## 2019-03-01 NOTE — Telephone Encounter (Signed)
If she has a BP monitor at home, can you instruct her to do orthostatics and let us know BP lying down, sitting, and standing? Can she ask her fiance to take a video of the twitching and upload to MyChart so we can see what they look like, thanks

## 2019-03-01 NOTE — Telephone Encounter (Signed)
Left message for pt to return call.

## 2019-03-05 NOTE — Telephone Encounter (Signed)
Left message for pt to return call.

## 2019-03-13 ENCOUNTER — Ambulatory Visit (INDEPENDENT_AMBULATORY_CARE_PROVIDER_SITE_OTHER): Payer: Medicaid Other

## 2019-03-13 ENCOUNTER — Other Ambulatory Visit: Payer: Self-pay

## 2019-03-13 DIAGNOSIS — Z3042 Encounter for surveillance of injectable contraceptive: Secondary | ICD-10-CM | POA: Diagnosis not present

## 2019-03-13 MED ORDER — MEDROXYPROGESTERONE ACETATE 150 MG/ML IM SUSP
150.0000 mg | Freq: Once | INTRAMUSCULAR | Status: AC
  Administered 2019-03-13: 15:00:00 150 mg via INTRAMUSCULAR

## 2019-03-13 NOTE — Progress Notes (Signed)
[facility] here for Depo-Provera  Injection.  Injection administered without complication. Patient will return in 3 months for next injection.  Verdell Carmine, RN 03/13/2019  2:33 PM

## 2019-03-15 NOTE — Progress Notes (Signed)
I have reviewed this chart and agree with the RN/CMA assessment and management.    Jeffree Cazeau C Djeneba Barsch, MD, FACOG Attending Physician, [facility]  

## 2019-05-29 ENCOUNTER — Other Ambulatory Visit: Payer: Self-pay

## 2019-05-29 ENCOUNTER — Ambulatory Visit (INDEPENDENT_AMBULATORY_CARE_PROVIDER_SITE_OTHER): Payer: Medicaid Other | Admitting: Emergency Medicine

## 2019-05-29 DIAGNOSIS — Z3042 Encounter for surveillance of injectable contraceptive: Secondary | ICD-10-CM | POA: Diagnosis not present

## 2019-05-29 MED ORDER — MEDROXYPROGESTERONE ACETATE 150 MG/ML IM SUSP
150.0000 mg | Freq: Once | INTRAMUSCULAR | Status: AC
  Administered 2019-05-29: 150 mg via INTRAMUSCULAR

## 2019-05-29 NOTE — Progress Notes (Signed)
[facility] here for Depo-Provera  Injection.  Injection administered without complication. Patient will return in 3 months for next injection.  Loma Sousa, RN 05/29/19 [phone]

## 2019-05-29 NOTE — Progress Notes (Signed)
Patient seen and assessed by nursing staff during this encounter. I have reviewed the chart and agree with the documentation and plan.  Kerry Hough, PA-C 05/29/2019 2:37 PM

## 2019-07-09 ENCOUNTER — Telehealth: Payer: Self-pay

## 2019-07-09 MED ORDER — ACYCLOVIR 200 MG PO CAPS: 400.0000 mg | ORAL_CAPSULE | Freq: Two times a day (BID) | ORAL | 6 refills | Status: AC

## 2019-07-09 NOTE — Telephone Encounter (Signed)
Pt called requesting a refill on her acyclovir due to her having a current outbreak.    Called pt and per Dr. Dione Plover pt can have a refill on her acyclovir.  Medication e-prescribed and pt notified.

## 2019-08-14 ENCOUNTER — Ambulatory Visit: Payer: Medicaid Other

## 2019-08-27 ENCOUNTER — Ambulatory Visit: Payer: Medicaid Other

## 2020-06-16 ENCOUNTER — Other Ambulatory Visit: Payer: Self-pay

## 2020-06-17 NOTE — Telephone Encounter (Signed)
Refill not appropriate. Patient needs to be seen by a provider

## 2020-09-27 IMAGING — MR MRI HEAD WITHOUT AND WITH CONTRAST
12 series · 48 of 48 positions shown · IV contrast (20ml Multihance)
Comparison: None.

CLINICAL DATA: 23 y/o F; ringing in the ears beginning during
pregnancy. Persistent floaters in the left eye beginning during
pregnancy. History of three seizures on the morning after giving
birth.

EXAM:
MRI HEAD WITHOUT AND WITH CONTRAST
TECHNIQUE: Multiplanar, multiecho pulse sequences of the brain and surrounding
structures were obtained without and with intravenous contrast.
CONTRAST:  20mL MULTIHANCE GADOBENATE DIMEGLUMINE 529 MG/ML IV SOLN

[Series 5: T1 · sagittal · 4.0mm · 0.75mm/px · 3 of 29 slices shown (1 of 3)]
[im 1/29]
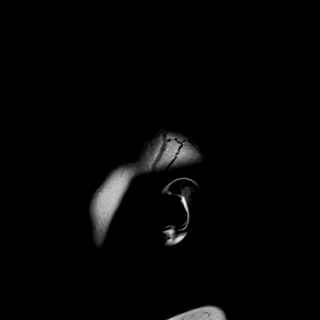
[im 15/29]
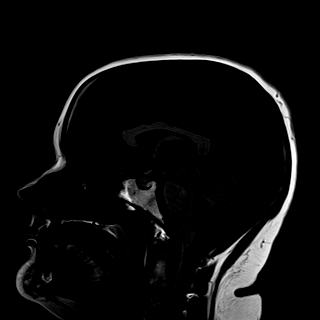
[im 29/29]
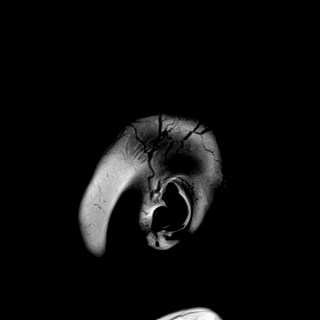

[Series 6: T2 · axial · 4.0mm · 0.36mm/px · z∈[-68,+67]mm · 2 of 27 slices shown (1 of 2)]
[im 1/27]
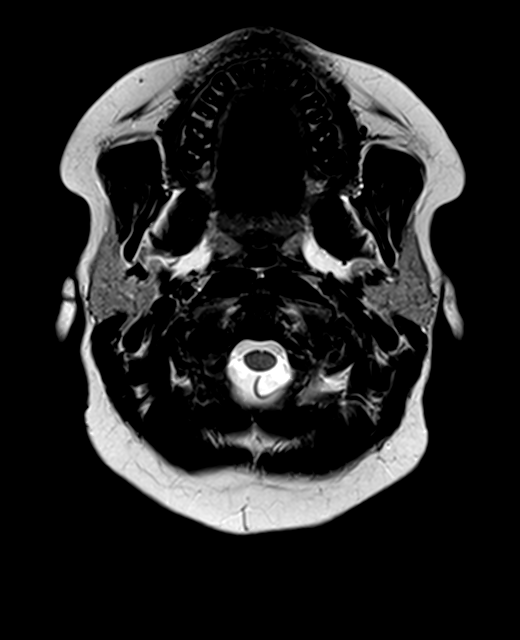
[im 27/27]
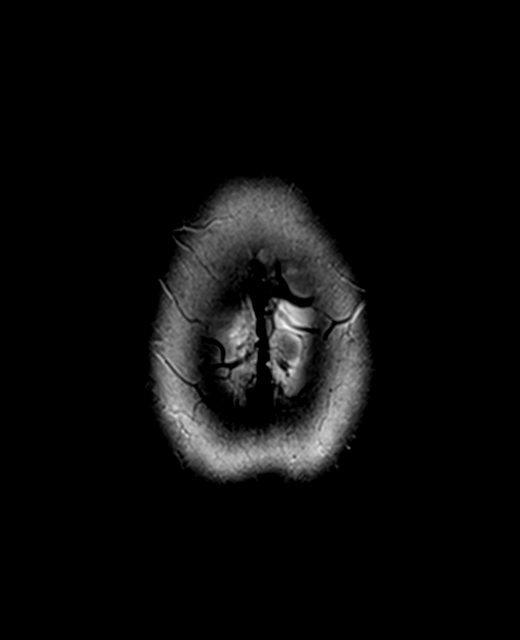

[Series 7: DWI · axial · 4.0mm · 0.90mm/px · z∈[-72,+65]mm · 5 of 63 slices shown (1 of 2)]
[im 1/63]
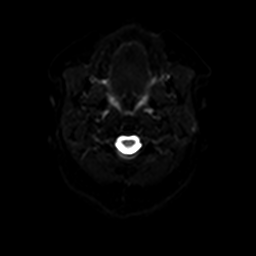
[im 16/63]
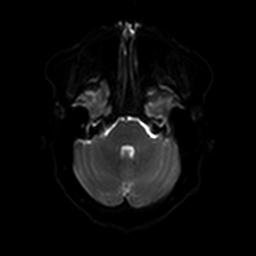
[im 32/63]
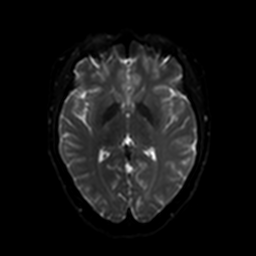
[im 47/63]
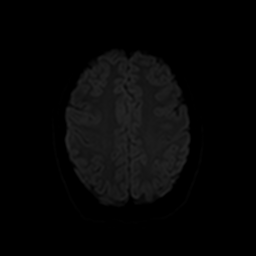
[im 63/63]
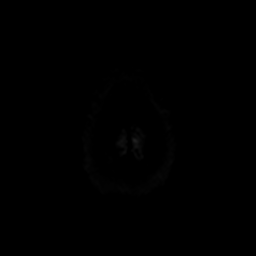

[Series 8: DWI · axial · 4.0mm · 0.90mm/px · z∈[-72,+65]mm · 2 of 32 slices shown (2 of 2)]
[im 1/32]
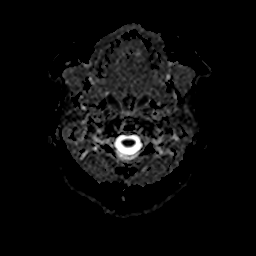
[im 32/32]
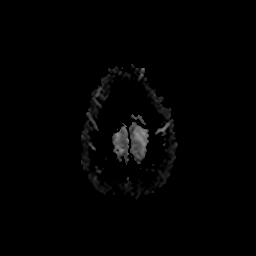

[Series 9: FLAIR · axial · 3.0mm · 0.72mm/px · z∈[-71,+71]mm · 3 of 40 slices shown (1 of 2)]
[im 1/40]
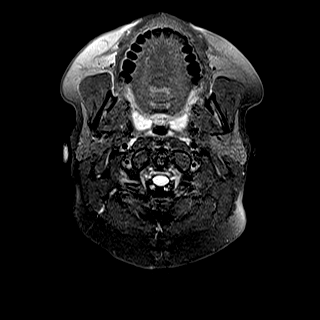
[im 20/40]
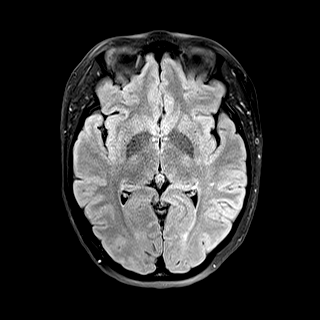
[im 40/40]
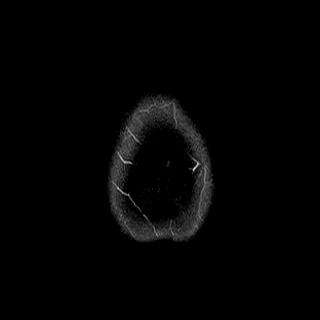

[Series 11: swi_images · axial · 4.0mm · 0.90mm/px · z∈[-71,+69]mm · 3 of 36 slices shown]
[im 1/36]
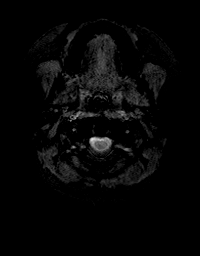
[im 18/36]
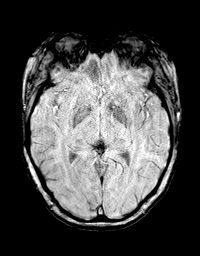
[im 36/36]
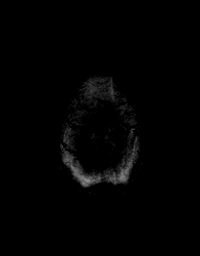

[Series 12: T1 · axial · 1.0mm · 0.90mm/px · z∈[-72,+71]mm · 11 of 144 slices shown (2 of 3)]
[im 1/144]
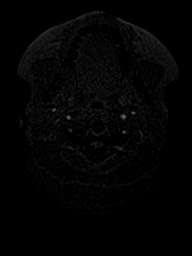
[im 15/144]
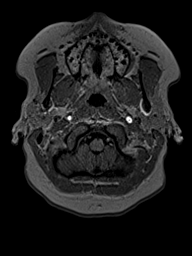
[im 29/144]
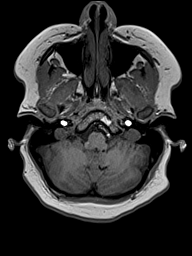
[im 43/144]
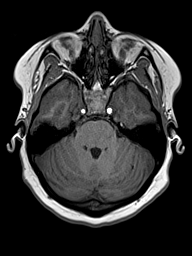
[im 58/144]
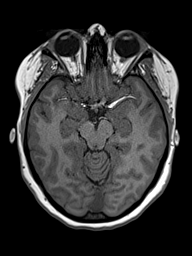
[im 72/144]
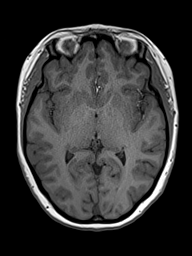
[im 86/144]
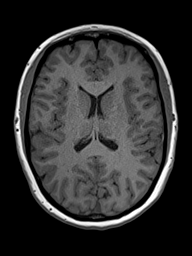
[im 101/144]
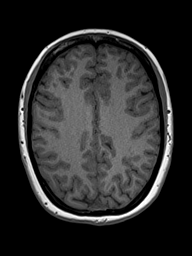
[im 115/144]
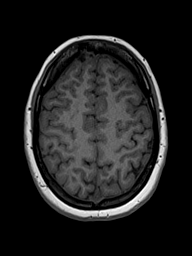
[im 129/144]
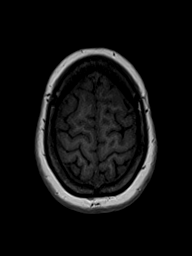
[im 144/144]
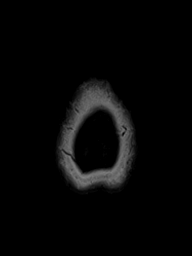

[Series 13: T2 · coronal · 3.0mm · 0.47mm/px · 2 of 25 slices shown (2 of 2)]
[im 1/25]
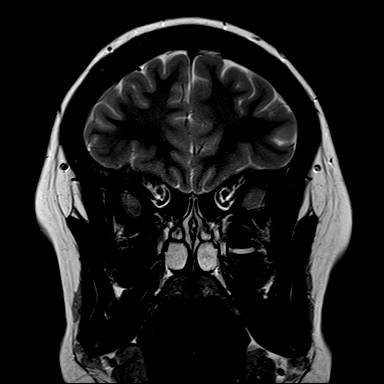
[im 25/25]
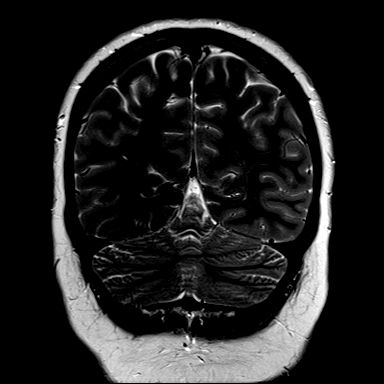

[Series 14: FLAIR · coronal · 3.0mm · 0.56mm/px · 2 of 25 slices shown (2 of 2)]
[im 1/25]
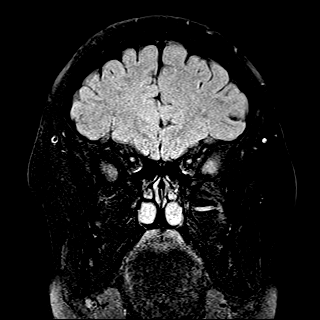
[im 25/25]
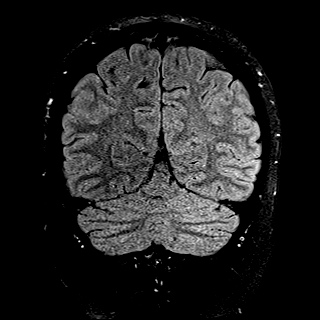

[Series 15: T2 post-contrast · coronal · 4.0mm · 0.36mm/px · 2 of 32 slices shown]
[im 1/32]
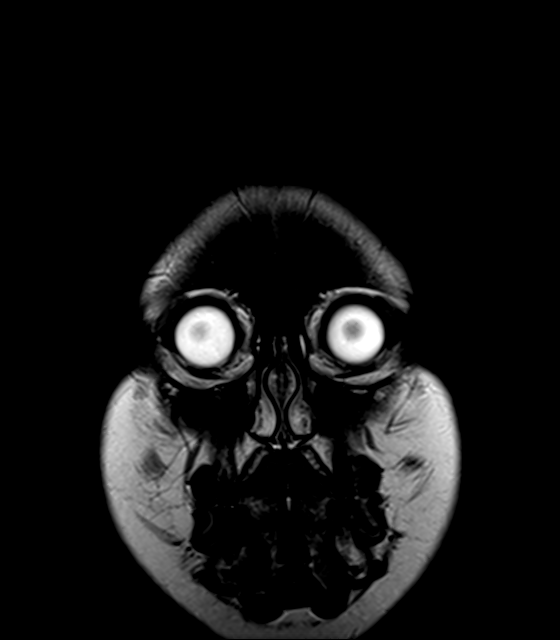
[im 32/32]
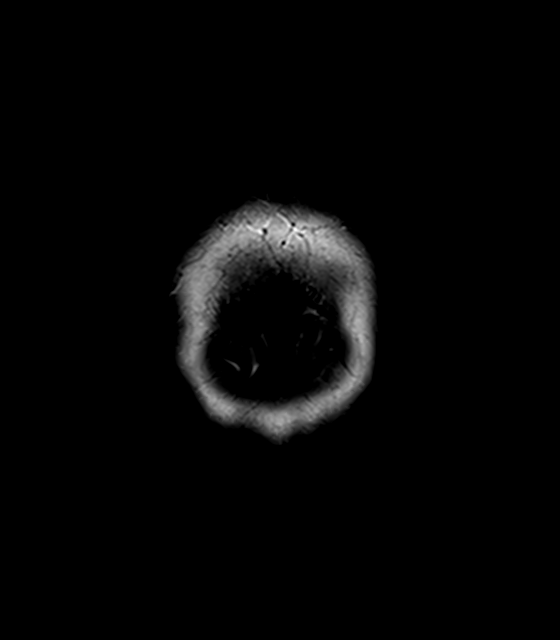

[Series 16: T1 · axial · 1.0mm · 0.90mm/px · z∈[-72,+71]mm · 11 of 144 slices shown (3 of 3)]
[im 1/144]
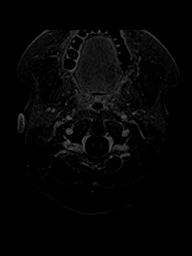
[im 15/144]
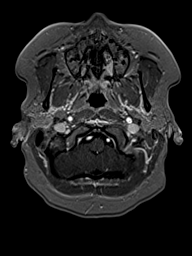
[im 29/144]
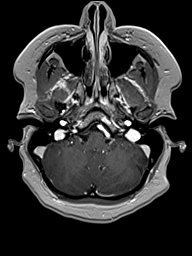
[im 43/144]
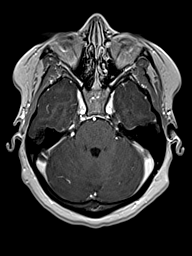
[im 58/144]
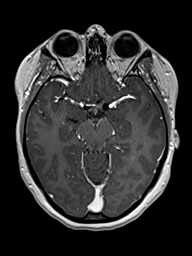
[im 72/144]
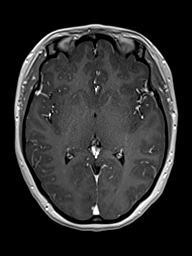
[im 86/144]
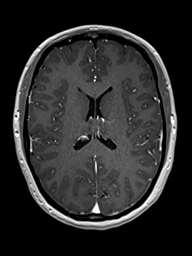
[im 101/144]
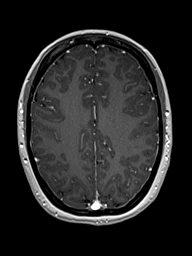
[im 115/144]
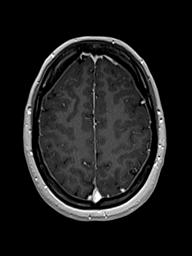
[im 129/144]
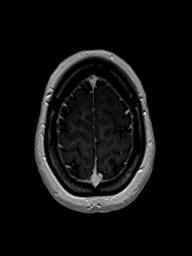
[im 144/144]
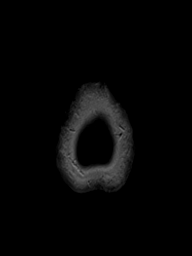

[Series 17: T1 post-contrast · coronal · 4.0mm · 0.72mm/px · 2 of 32 slices shown]
[im 1/32]
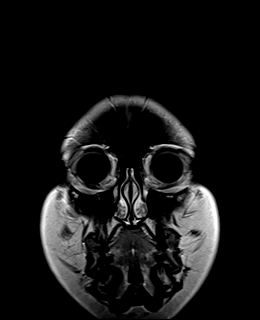
[im 32/32]
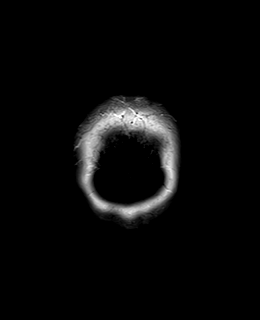

[48 of 48 positions shown; findings below may reference images not displayed]

FINDINGS: Brain: No acute infarction, hemorrhage, hydrocephalus, extra-axial
collection or mass lesion. Midline structures are intact including a
morphologically normal pituitary as well as a complete corpus
callosum and vermis. No Chiari malformation. No evidence of cortical
dysplasia, heterotopia, or disorder of cortical formation is
identified. The left hemi brain including hippocampus is mildly
increased in signal on the thin section T2 coronal sequence which
likely represents a field inhomogeneity affect and is not repeated
on other sequences. Hippocampi by are symmetric in size and signal
bilaterally on additional sequences. After administration of
intravenous contrast there is no abnormal enhancement.

Vascular: Normal flow voids.

Skull and upper cervical spine: Normal marrow signal.

Sinuses/Orbits: Negative.

Other: None.
IMPRESSION: No acute process or structural cause of seizure identified.
Unremarkable MRI of the brain.
